# Patient Record
Sex: Female | Born: 1995 | Race: White | Hispanic: No | Marital: Single | Smoking: Never smoker
Health system: Southern US, Community
[De-identification: ages and names within clinical notes are randomized; demographics above are authoritative.]

## PROBLEM LIST (undated history)

## (undated) DIAGNOSIS — J382 Nodules of vocal cords: Secondary | ICD-10-CM

## (undated) DIAGNOSIS — N201 Calculus of ureter: Secondary | ICD-10-CM

## (undated) DIAGNOSIS — N200 Calculus of kidney: Secondary | ICD-10-CM

## (undated) HISTORY — PX: WISDOM TOOTH EXTRACTION: SHX21

## (undated) HISTORY — DX: Nodules of vocal cords: J38.2

---
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
## 2016-10-12 ENCOUNTER — Encounter (HOSPITAL_COMMUNITY): Payer: Self-pay

## 2016-10-12 ENCOUNTER — Emergency Department (HOSPITAL_COMMUNITY): Payer: PRIVATE HEALTH INSURANCE

## 2016-10-12 ENCOUNTER — Emergency Department (HOSPITAL_COMMUNITY)
Admission: EM | Admit: 2016-10-12 | Discharge: 2016-10-12 | Disposition: A | Discharge status: Home / Self Care | Payer: PRIVATE HEALTH INSURANCE | Attending: Emergency Medicine | Admitting: Emergency Medicine

## 2016-10-12 DIAGNOSIS — R0789 Other chest pain: Secondary | ICD-10-CM | POA: Insufficient documentation

## 2016-10-12 DIAGNOSIS — R079 Chest pain, unspecified: Secondary | ICD-10-CM | POA: Diagnosis present

## 2016-10-12 MED ORDER — KETOROLAC TROMETHAMINE 30 MG/ML IJ SOLN
30.0000 mg | Freq: Once | INTRAMUSCULAR | Status: AC
  Administered 2016-10-12: 30 mg via INTRAMUSCULAR
  Filled 2016-10-12: qty 1

## 2016-10-12 MED ORDER — IBUPROFEN 600 MG PO TABS: 600.0000 mg | ORAL_TABLET | Freq: Four times a day (QID) | ORAL | 0 refills | Status: DC | PRN

## 2016-10-12 MED ORDER — ALBUTEROL SULFATE (2.5 MG/3ML) 0.083% IN NEBU: 5.0000 mg | INHALATION_SOLUTION | Freq: Once | RESPIRATORY_TRACT | Status: DC

## 2016-10-12 MED ORDER — OXYCODONE-ACETAMINOPHEN 5-325 MG PO TABS
1.0000 | ORAL_TABLET | ORAL | Status: DC | PRN
  Administered 2016-10-12: 1 via ORAL
  Filled 2016-10-12: qty 1

## 2016-10-12 NOTE — ED Notes (Signed)
Pain medication given in Triage. Patient advised about side effects of medications and  to avoid driving for a minimum of 4 hours.  

## 2016-10-12 NOTE — ED Notes (Signed)
Pt is c/o chest pain to left side of chest 6/10 described as sharp, That began at 3pm  pt repost that she took a percocet and was effective  For a awhile until she awoken and cough and the pain came back. Pt was placed on a monitor

## 2016-10-12 NOTE — ED Provider Notes (Signed)
[facility]-EMERGENCY DEPT Provider Note   CSN: [id number] Arrival date & time: 10/12/16  1535 By signing my name below, I, Levon Hedger, attest that this documentation has been prepared under the direction and in the presence of non-physician practitioner, Audry Pili, PA-C. Electronically Signed: Levon Hedger, Scribe. 10/12/2016. 8:29 PM.   History   Chief Complaint Chief Complaint  Patient presents with  . Chest Pain   HPI Hannah Cummings is an otherwise healthy 21 y.o. female who presents to the Emergency Department complaining of sudden onset, intermittent left upper chest pain onset today at 3 pm. She states the pain began after coughing this afternoon. Pt describes the pain as sharp, 6/10 pain that occurs every 5 seconds and then resolves. She was given percocet with relief of pain. Pt notes associated dizziness and some left arm pain. This is the first time she has experienced this. No FHx of heart problems or personal  hx of PE or DVT. No recent travel or surgeries. She denies any diaphoresis, nausea, vomiting, abdominal pain, or fever.   The history is provided by the patient. No language interpreter was used.    History reviewed. No pertinent past medical history.  There are no active problems to display for this patient.   History reviewed. No pertinent surgical history.  OB History    No data available      Home Medications    Prior to Admission medications   Not on File    Family History History reviewed. No pertinent family history.  Social History Social History  Substance Use Topics  . Smoking status: Never Smoker  . Smokeless tobacco: Never Used  . Alcohol use No    Allergies   Patient has no allergy information on record.   Review of Systems Review of Systems 10 systems reviewed and all are negative for acute change except as noted in the HPI.   Physical Exam Updated Vital Signs BP 119/67 (BP Location: Left Arm)   Pulse 80   Temp 98.4 F (36.9  C) (Oral)   Resp 18   LMP 10/10/2016   SpO2 100%   Physical Exam  Constitutional: She is oriented to person, place, and time. Vital signs are normal. She appears well-developed and well-nourished. No distress.  HENT:  Head: Normocephalic and atraumatic.  Right Ear: Hearing normal.  Left Ear: Hearing normal.  Eyes: Conjunctivae and EOM are normal. Pupils are equal, round, and reactive to light.  Neck: Normal range of motion. Neck supple.  Cardiovascular: Normal rate, regular rhythm, normal heart sounds, intact distal pulses and normal pulses.   No murmur heard. Pulmonary/Chest: Effort normal and breath sounds normal. She has no decreased breath sounds. She has no wheezes. She has no rhonchi. She has no rales. She exhibits tenderness.    TTP Left Upper Anterior chest wall. No palpable or visible deformities.   Abdominal: She exhibits no distension.  Musculoskeletal: Normal range of motion.  Neurological: She is alert and oriented to person, place, and time.  Skin: Skin is warm and dry.  Psychiatric: She has a normal mood and affect. Her speech is normal and behavior is normal. Thought content normal.  Nursing note and vitals reviewed.   ED Treatments / Results  DIAGNOSTIC STUDIES:  Oxygen Saturation is 100% on RA, normal by my interpretation.    COORDINATION OF CARE:  8:26 PM Discussed treatment plan with pt at bedside and pt agreed to plan.   Labs (all labs ordered are listed, but only abnormal results  are displayed) Labs Reviewed - No data to display  EKG  EKG Interpretation None      Radiology Dg Chest 2 View  Result Date: 10/12/2016 CLINICAL DATA:  Onset of coughing 1 of prior to admission to the ED with subsequent severe stabbing left upper left chest pain. Nonsmoker. EXAM: CHEST  2 VIEW COMPARISON:  None in PACs FINDINGS: The lungs are well-expanded. There is no focal infiltrate. There is no pleural effusion, pneumothorax, or pneumomediastinum. The heart and  pulmonary vascularity are normal. The mediastinum is normal in width. The trachea is midline. The bony thorax is unremarkable. The gas pattern in the upper abdomen is normal where visualized. IMPRESSION: There is no acute cardiopulmonary abnormality. Electronically Signed   By: David  SwazilandJordan M.D.   On: 10/12/2016 16:51   Procedures Procedures (including critical care time)  Medications Ordered in ED Medications - No data to display   Initial Impression / Assessment and Plan / ED Course  I have reviewed the triage vital signs and the nursing notes.  Pertinent labs & imaging results that were available during my care of the patient were reviewed by me and considered in my medical decision making (see chart for details).  Clinical Course    Final Clinical Impressions(s) / ED Diagnoses   {I have reviewed and evaluated the relevant imaging studies. {I have interpreted the relevant EKG. {I have reviewed the relevant previous healthcare records.  {I obtained HPI from historian.   ED Course:  Assessment: Pt is a 20yF presents with CP s/p coughing at 3pm. Constant. No N/V. No diaphoresis. Risk Factors for ACS none. Given analgesia in ED. Patient is to be discharged with recommendation to follow up with PCP in regards to today's hospital visit. Chest pain is not likely of cardiac or pulmonary etiology d/t presentation, perc negative, VSS, no tracheal deviation, no JVD or new murmur, RRR, breath sounds equal bilaterally, EKG without acute abnormalities and negative CXR. Heart Score. Likely muscle strain from forceful cough. Pain reproducible on exam. Pt has been advised start a NSAIds and return to the ED is CP becomes exertional, associated with diaphoresis or nausea, radiates to left jaw/arm, worsens or becomes concerning in any way. Pt appears reliable for follow up and is agreeable to discharge. Patient is in no acute distress. Vital Signs are stable. Patient is able to ambulate. Patient able to  tolerate PO.   Disposition/Plan:  DC Home Additional Verbal discharge instructions given and discussed with patient.  Pt Instructed to f/u with PCP in the next week for evaluation and treatment of symptoms. Return precautions given Pt acknowledges and agrees with plan  Supervising Physician Lyndal Pulleyaniel Knott, MD  Final diagnoses:  Chest wall pain    New Prescriptions New Prescriptions   No medications on file   I personally performed the services described in this documentation, which was scribed in my presence. The recorded information has been reviewed and is accurate.    Audry Piliyler Jiovani Mccammon, PA-C 10/12/16 2116    Lyndal Pulleyaniel Knott, MD 10/13/16 [phone]

## 2016-10-12 NOTE — Discharge Instructions (Signed)
Please read and follow all provided instructions.  Your diagnoses today include:  1. Chest wall pain     Tests performed today include: An EKG of your heart A chest x-ray Vital signs. See below for your results today.   Medications prescribed:   Take any prescribed medications only as directed.  Follow-up instructions: Please follow-up with your primary care provider as soon as you can for further evaluation of your symptoms.   Return instructions:  SEEK IMMEDIATE MEDICAL ATTENTION IF: You have severe chest pain, especially if the pain is crushing or pressure-like and spreads to the arms, back, neck, or jaw, or if you have sweating, nausea (feeling sick to your stomach), or shortness of breath. THIS IS AN EMERGENCY. Don't wait to see if the pain will go away. Get medical help at once. Call 911 or 0 (operator). DO NOT drive yourself to the hospital.  Your chest pain gets worse and does not go away with rest.  You have an attack of chest pain lasting longer than usual, despite rest and treatment with the medications your caregiver has prescribed.  You wake from sleep with chest pain or shortness of breath. You feel dizzy or faint. You have chest pain not typical of your usual pain for which you originally saw your caregiver.  You have any other emergent concerns regarding your health.  Additional Information: Chest pain comes from many different causes. Your caregiver has diagnosed you as having chest pain that is not specific for one problem, but does not require admission.  You are at low risk for an acute heart condition or other serious illness.   Your vital signs today were: BP 119/67 (BP Location: Left Arm)    Pulse 80    Temp 98.4 F (36.9 C) (Oral)    Resp 18    LMP 10/10/2016    SpO2 100%  If your blood pressure (BP) was elevated above 135/85 this visit, please have this repeated by your doctor within one month. --------------

## 2016-10-12 NOTE — ED Triage Notes (Signed)
Pt c/o sharp L chest pain and hyperventilation following a cough this afternoon and coughing x 1 week.  Pain score 6/10. Denies health Hx.    Lung sounds are clear.

## 2019-10-03 HISTORY — PX: FOOT FRACTURE SURGERY: SHX645

## 2019-11-10 HISTORY — PX: ANKLE ARTHROSCOPY WITH ARTHRODESIS: SHX5579

## 2021-05-02 HISTORY — PX: WRIST GANGLION EXCISION: SUR520

## 2021-05-02 HISTORY — PX: GANGLION CYST EXCISION: SHX1691

## 2021-10-28 ENCOUNTER — Ambulatory Visit (INDEPENDENT_AMBULATORY_CARE_PROVIDER_SITE_OTHER): Payer: No Typology Code available for payment source | Admitting: Nurse Practitioner

## 2021-10-28 ENCOUNTER — Other Ambulatory Visit: Payer: Self-pay

## 2021-10-28 ENCOUNTER — Encounter: Payer: Self-pay | Admitting: Nurse Practitioner

## 2021-10-28 VITALS — HR 96 | Resp 16 | Wt 139.0 lb

## 2021-10-28 DIAGNOSIS — Z0000 Encounter for general adult medical examination without abnormal findings: Secondary | ICD-10-CM | POA: Diagnosis not present

## 2021-10-28 DIAGNOSIS — R070 Pain in throat: Secondary | ICD-10-CM | POA: Diagnosis not present

## 2021-10-28 DIAGNOSIS — N946 Dysmenorrhea, unspecified: Secondary | ICD-10-CM | POA: Diagnosis not present

## 2021-10-28 DIAGNOSIS — J069 Acute upper respiratory infection, unspecified: Secondary | ICD-10-CM

## 2021-10-28 LAB — POCT RAPID STREP A (OFFICE): Rapid Strep A Screen: NEGATIVE

## 2021-10-28 NOTE — Progress Notes (Signed)
[address], [place]  [address] Phone:  [phone]   Fax:  [phone] Subjective:   Patient ID: Hannah Cummings, female    DOB: [birth year], 26 y.o.   MRN: [id number]  Chief Complaint  Patient presents with   New Patient (Initial Visit)   HPI Donne Robillard 26 y.o. female with no significant medical history to the [facility] for throat pain and to establish care.  Patient states that she has had throat pain x 2 days. Currently rates pain 7-8/10 and describes as soreness. Over the past 2-3 days has had nausea/ vomiting and fever, with highest fever being 101. Took tylenol with moderate relief is symptoms, last fever 99 F. 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   Generally eats healthy and exercises regularly. Last PCP visit 6 mths ago, recently moved to [place] from [place]. Denies any sexual activity at this time, on birth control pills.   Patient concerned about worsening pelvic cramps over the past 2-3 mths. States that she has had severe cramps in the past that resolved with birth control, but now that cramps have returned. Rates pain 10/10, when it occurs. States that pelvic cramps occur 2-3 days prior to menstrual cycle. Also endorses having nausea and vomiting due to cramps. Concerned for possible ovarian cyst, requesting additional screening. Denies any vaginal discharge or other genitourinary symptoms. Denies any cramps or nausea/ vomititng during visit. Denies any other complaints today.  Denies any fever. Denies any fatigue, chest pain, shortness of breath, HA or dizziness. Denies any blurred vision, numbness or tingling.   No past medical history on file.  No past surgical history on file.  No family history on file.  Social History   Socioeconomic History   Marital status: Single    Spouse name: Not on file   Number of children: Not on file   Years of education: Not on file   Highest education  level: Not on file  Occupational History   Not on file  Tobacco Use   Smoking status: Never   Smokeless tobacco: Never  Substance and Sexual Activity   Alcohol use: No   Drug use: No   Sexual activity: Not on file  Other Topics Concern   Not on file  Social History Narrative   Not on file   Social Determinants of Health   Financial Resource Strain: Not on file  Food Insecurity: Not on file  Transportation Needs: Not on file  Physical Activity: Not on file  Stress: Not on file  Social Connections: Not on file  Intimate Partner Violence: Not on file    Outpatient Medications Prior to Visit  Medication Sig Dispense Refill   ibuprofen (ADVIL,MOTRIN) 600 MG tablet Take 1 tablet (600 mg total) by mouth every 6 (six) hours as needed. 30 tablet 0   No facility-administered medications prior to visit.    Not on File  Review of Systems  Constitutional:  Positive for fever. Negative for chills and malaise/fatigue.  HENT:  Positive for sore throat. Negative for congestion, ear discharge, ear pain, hearing loss, nosebleeds, sinus pain and tinnitus.   Eyes: Negative.   Respiratory:  Negative for cough, sputum production, shortness of breath, wheezing and stridor.   Cardiovascular:  Negative for chest pain, palpitations and leg swelling.  Gastrointestinal:  Positive for nausea and vomiting. Negative for abdominal pain, blood in stool, constipation and diarrhea.  Genitourinary: Negative.   Musculoskeletal: Negative.   Skin:  Negative.   Neurological: Negative.   Psychiatric/Behavioral:  Negative for depression. The patient is not nervous/anxious.   All other systems reviewed and are negative.     Objective:    Physical Exam Vitals reviewed.  Constitutional:      General: She is not in acute distress.    Appearance: Normal appearance. She is normal weight.  HENT:     Head: Normocephalic.     Right Ear: Tympanic membrane, ear canal and external ear normal.     Left Ear:  Tympanic membrane, ear canal and external ear normal.     Nose: Nose normal.     Mouth/Throat:     Mouth: Mucous membranes are moist.     Pharynx: Oropharynx is clear.  Eyes:     Extraocular Movements: Extraocular movements intact.     Conjunctiva/sclera: Conjunctivae normal.     Pupils: Pupils are equal, round, and reactive to light.  Neck:     Vascular: No carotid bruit.  Cardiovascular:     Rate and Rhythm: Normal rate and regular rhythm.     Pulses: Normal pulses.     Heart sounds: Normal heart sounds.     Comments: No obvious peripheral edema Pulmonary:     Effort: Pulmonary effort is normal.     Breath sounds: Normal breath sounds.  Abdominal:     General: Abdomen is flat. Bowel sounds are normal. There is no distension.     Palpations: Abdomen is soft. There is no mass.     Tenderness: There is no abdominal tenderness. There is no right CVA tenderness, left CVA tenderness, guarding or rebound.     Hernia: No hernia is present.  Musculoskeletal:        General: No swelling, tenderness, deformity or signs of injury. Normal range of motion.     Cervical back: Normal range of motion and neck supple. No rigidity or tenderness.     Right lower leg: No edema.     Left lower leg: No edema.  Lymphadenopathy:     Cervical: No cervical adenopathy.  Skin:    General: Skin is warm and dry.     Capillary Refill: Capillary refill takes less than 2 seconds.  Neurological:     General: No focal deficit present.     Mental Status: She is alert and oriented to person, place, and time.  Psychiatric:        Mood and Affect: Mood normal.        Behavior: Behavior normal.        Thought Content: Thought content normal.        Judgment: Judgment normal.    Pulse 96    Resp 16    Wt 139 lb (63 kg)    SpO2 98%  Wt Readings from Last 3 Encounters:  10/28/21 139 lb (63 kg)    Immunization History  Administered Date(s) Administered   Influenza-Unspecified 07/06/2021    Diabetic Foot  Exam - Simple   No data filed     No results found for: TSH No results found for: WBC, HGB, HCT, MCV, PLT No results found for: NA, K, CHLORIDE, CO2, GLUCOSE, BUN, CREATININE, BILITOT, ALKPHOS, AST, ALT, PROT, ALBUMIN, CALCIUM, ANIONGAP, EGFR, GFR No results found for: CHOL No results found for: HDL No results found for: LDLCALC No results found for: TRIG No results found for: CHOLHDL No results found for: HGBA1C     Assessment & Plan:   Problem List Items Addressed This Visit  None Visit Diagnoses     Throat pain    -  Primary   Relevant Orders   Rapid Strep A (Completed), negative and reassuring Discussed non pharmacological methods for management of symptoms  Informed to take OTC medications as needed for symptoms    Well adult exam     Patient refused completion of labs due to concern for billing   URI Informed to continue taking OTC medications as needed for symptoms Severe menstrual cramps       Relevant Orders   US Transvaginal Non-OB, to be schedule by support staff Referral to Gyn pending [place] results Informed to take OTC medications as needed for pain   Follow up in 3 mths for reevaluation of symptoms, sooner as needed    I am having Shana Chute maintain her ibuprofen.  No orders of the defined types were placed in this encounter.    Teena Dunk, NP

## 2021-10-28 NOTE — Progress Notes (Signed)
Patient states to having a painful throat, left side is more painful than right today. Pt states vomiting the night before . No cough and fever was broken last night. Temp at 98.0

## 2021-10-28 NOTE — Patient Instructions (Signed)
You were seen today in the [facility] for throat pain and wellness exam. Labs were collected, results will be available via MyChart or, if abnormal, you will be contacted by clinic staff. You were prescribed medications, please take as directed. Please follow up in 3 mths for reevaluation of symptoms.

## 2021-11-03 ENCOUNTER — Ambulatory Visit (HOSPITAL_COMMUNITY): Admission: RE | Admit: 2021-11-03 | Payer: Self-pay | Source: Ambulatory Visit

## 2022-01-03 ENCOUNTER — Emergency Department (HOSPITAL_COMMUNITY): Payer: No Typology Code available for payment source

## 2022-01-03 ENCOUNTER — Emergency Department (HOSPITAL_COMMUNITY)
Admission: EM | Admit: 2022-01-03 | Discharge: 2022-01-03 | Disposition: A | Discharge status: Home / Self Care | Payer: No Typology Code available for payment source | Attending: Emergency Medicine | Admitting: Emergency Medicine

## 2022-01-03 ENCOUNTER — Encounter (HOSPITAL_COMMUNITY): Payer: Self-pay | Admitting: Emergency Medicine

## 2022-01-03 DIAGNOSIS — K529 Noninfective gastroenteritis and colitis, unspecified: Secondary | ICD-10-CM | POA: Diagnosis not present

## 2022-01-03 DIAGNOSIS — R1031 Right lower quadrant pain: Secondary | ICD-10-CM | POA: Diagnosis present

## 2022-01-03 LAB — COMPREHENSIVE METABOLIC PANEL
ALT: 17 U/L (ref 0–44)
AST: 19 U/L (ref 15–41)
Albumin: 4.2 g/dL (ref 3.5–5.0)
Alkaline Phosphatase: 55 U/L (ref 38–126)
Anion gap: 9 (ref 5–15)
BUN: 13 mg/dL (ref 6–20)
CO2: 22 mmol/L (ref 22–32)
Calcium: 9.6 mg/dL (ref 8.9–10.3)
Chloride: 108 mmol/L (ref 98–111)
Creatinine, Ser: 0.7 mg/dL (ref 0.44–1.00)
GFR, Estimated: >60 mL/min (ref >60)
Glucose, Bld: 95 mg/dL (ref 70–99)
Potassium: 3.7 mmol/L (ref 3.5–5.1)
Sodium: 139 mmol/L (ref 135–145)
Total Bilirubin: 0.9 mg/dL (ref 0.3–1.2)
Total Protein: 7.2 g/dL (ref 6.5–8.1)

## 2022-01-03 LAB — CBC
HCT: 40.3 % (ref 36.0–46.0)
Hemoglobin: 13.8 g/dL (ref 12.0–15.0)
MCH: 30 pg (ref 26.0–34.0)
MCHC: 34.2 g/dL (ref 30.0–36.0)
MCV: 87.6 fL (ref 80.0–100.0)
Platelets: 275 10*3/uL (ref 150–400)
RBC: 4.6 MIL/uL (ref 3.87–5.11)
RDW: 12.9 % (ref 11.5–15.5)
WBC: 7 10*3/uL (ref 4.0–10.5)
nRBC: 0 % (ref 0.0–0.2)

## 2022-01-03 LAB — LIPASE, BLOOD: Lipase: 28 U/L (ref 11–51)

## 2022-01-03 LAB — URINALYSIS, ROUTINE W REFLEX MICROSCOPIC
Bilirubin Urine: NEGATIVE
Glucose, UA: NEGATIVE mg/dL
Hgb urine dipstick: NEGATIVE
Ketones, ur: NEGATIVE mg/dL
Leukocytes,Ua: NEGATIVE
Nitrite: NEGATIVE
Protein, ur: NEGATIVE mg/dL
Specific Gravity, Urine: 1.026 (ref 1.005–1.030)
pH: 6 (ref 5.0–8.0)

## 2022-01-03 LAB — I-STAT BETA HCG BLOOD, ED (MC, WL, AP ONLY): I-stat hCG, quantitative: <5 m[IU]/mL (ref <5)

## 2022-01-03 MED ORDER — MORPHINE SULFATE (PF) 4 MG/ML IV SOLN
4.0000 mg | Freq: Once | INTRAVENOUS | Status: AC
  Administered 2022-01-03: 4 mg via INTRAVENOUS
  Filled 2022-01-03: qty 1

## 2022-01-03 MED ORDER — IOHEXOL 300 MG/ML  SOLN
100.0000 mL | Freq: Once | INTRAMUSCULAR | Status: AC | PRN
  Administered 2022-01-03: 100 mL via INTRAVENOUS

## 2022-01-03 MED ORDER — DICYCLOMINE HCL 20 MG PO TABS: 20.0000 mg | ORAL_TABLET | Freq: Two times a day (BID) | ORAL | 0 refills | Status: DC

## 2022-01-03 MED ORDER — LACTATED RINGERS IV BOLUS
1000.0000 mL | Freq: Once | INTRAVENOUS | Status: AC
  Administered 2022-01-03: 1000 mL via INTRAVENOUS

## 2022-01-03 MED ORDER — ONDANSETRON 4 MG PO TBDP
4.0000 mg | ORAL_TABLET | Freq: Once | ORAL | Status: AC | PRN
  Administered 2022-01-03: 4 mg via ORAL
  Filled 2022-01-03: qty 1

## 2022-01-03 MED ORDER — ONDANSETRON 4 MG PO TBDP: 4.0000 mg | ORAL_TABLET | Freq: Three times a day (TID) | ORAL | 0 refills | Status: AC | PRN

## 2022-01-03 NOTE — Discharge Instructions (Addendum)
You were evaluated in the Emergency Department and after careful evaluation, we did not find any emergent condition requiring admission or further testing in the hospital. ? ?Your exam/testing today was overall reassuring.  Your laboratory work-up was completely unremarkable.  Your pelvic ultrasound showed trace free fluid but without as otherwise unremarkable with normal ovaries and uterus.  Your CT abdomen pelvis showed nonobstructing kidney stones in both kidneys which should not be causing your symptoms.  Your symptoms are most consistent with likely gastroenteritis.  Ensure you are continuing to push fluids. Zofran for nausea. Tylenol and Motrin for pain. ? ?Please return to the Emergency Department if you experience any worsening of your condition.  Thank you for allowing [place] to be a part of your care. ? ?

## 2022-01-03 NOTE — ED Triage Notes (Signed)
Patient complains of sudden onset of right lower abdominal pain into her groin that started as a pressure at 0800 this morning as if she needed to have a bowel movement, patient states after bowel movement the pain became sharp. Patient is alert and oriented also reports nausea and vomiting this morning.  ?

## 2022-01-03 NOTE — ED Provider Notes (Signed)
?  Physical Exam  ?BP 111/72   Pulse 78   Temp 98.3 ?F (36.8 ?C) (Oral)   Resp 15   LMP  (Within Days)   SpO2 100%  ? ? ? ?Procedures  ?Procedures ? ?ED Course / MDM  ?  ?Medical Decision Making ?Amount and/or Complexity of Data Reviewed ?Labs: ordered. ?Radiology: ordered. ? ?Risk ?Prescription drug management. ? ? ?Received the patient in signout from [facility] [place].  Briefly, this is a 26 year old female who has had intermittent abdominal discomfort over the past few months.  This morning, she developed sudden onset nausea and vomiting, crampy right lower abdominal discomfort with associated episodes of nonbloody diarrhea x2, described as profusely watery.  Her emesis has been NBNB.  She is currently asymptomatic and tolerating oral intake.  Vitals reviewed and stable.  Extensive work-up performed has been generally unremarkable.  Pelvic ultrasound imaging with trace physiologic free fluid, no other abnormalities, CT abdomen pelvis with bilateral nonobstructing nephrolithiasis.  Symptoms are most consistent with a likely gastroenteritis.  Will prescribe Zofran ODT encourage oral hydration, Tylenol or ibuprofen for pain control, Bentyl and outpatient follow-up.  Stable for discharge. ? ? ? ? ?  ?Ernie Avena, MD ?01/03/22 1515 ? ?

## 2022-01-03 NOTE — ED Provider Notes (Signed)
?[facility] ?Provider Note ? ? ?CSN: [id number] ?Arrival date & time: 01/03/22  0946 ? ?  ? ?History ? ?Chief Complaint  ?Patient presents with  ? Abdominal Pain  ? ? ?Hannah Cummings is a 26 y.o. female otherwise healthy female presenting to the ED for evaluation of right lower quadrant pain that started significantly early this morning.  Patient notes that she has had a dull pain in her right lower quadrant for maybe a month or 2, however this morning she woke up with significant increase in pain this morning along with nausea and vomiting.  She denies fever, chills, urinary symptoms.  She has had some mild diarrhea.  No other treatment prior to arrival.  She denies vaginal pain, discharge.  She has been off birth control for approximately 1 year. ? ? ?Abdominal Pain ? ?  ? ?Home Medications ?Prior to Admission medications   ?Medication Sig Start Date End Date Taking? Authorizing Provider  ?dicyclomine (BENTYL) 20 MG tablet Take 1 tablet (20 mg total) by mouth 2 (two) times daily. 01/03/22  Yes Ernie AvenaLawsing, James, MD  ?ibuprofen (ADVIL) 200 MG tablet Take 200 mg by mouth 2 (two) times daily as needed for mild pain.   Yes [provider]  ?ondansetron (ZOFRAN-ODT) 4 MG disintegrating tablet Take 1 tablet (4 mg total) by mouth every 8 (eight) hours as needed for nausea or vomiting. 01/03/22  Yes Ernie AvenaLawsing, James, MD  ?ibuprofen (ADVIL,MOTRIN) 600 MG tablet Take 1 tablet (600 mg total) by mouth every 6 (six) hours as needed. ?Patient not taking: Reported on 01/03/2022 10/12/16   Audry PiliMohr, Tyler, PA-C  ?   ? ?Allergies    ?Patient has no known allergies.   ? ?Review of Systems   ?Review of Systems  ?Gastrointestinal:  Positive for abdominal pain.  ? ?Physical Exam ?Updated Vital Signs ?BP 111/72   Pulse 78   Temp 98.3 ?F (36.8 ?C) (Oral)   Resp 15   LMP  (Within Days)   SpO2 100%  ?Physical Exam ?Vitals and nursing note reviewed.  ?Constitutional:   ?   General: She is not in acute  distress. ?   Appearance: She is not ill-appearing.  ?HENT:  ?   Head: Atraumatic.  ?Eyes:  ?   Conjunctiva/sclera: Conjunctivae normal.  ?Cardiovascular:  ?   Rate and Rhythm: Normal rate and regular rhythm.  ?   Pulses: Normal pulses.  ?   Heart sounds: No murmur heard. ?Pulmonary:  ?   Effort: Pulmonary effort is normal. No respiratory distress.  ?   Breath sounds: Normal breath sounds.  ?Abdominal:  ?   General: Abdomen is flat. There is no distension.  ?   Palpations: Abdomen is soft.  ?   Tenderness: There is abdominal tenderness in the right lower quadrant and suprapubic area. There is no right CVA tenderness or left CVA tenderness.  ?Musculoskeletal:     ?   General: Normal range of motion.  ?   Cervical back: Normal range of motion.  ?Skin: ?   General: Skin is warm and dry.  ?   Capillary Refill: Capillary refill takes less than 2 seconds.  ?Neurological:  ?   General: No focal deficit present.  ?   Mental Status: She is alert.  ?Psychiatric:     ?   Mood and Affect: Mood normal.  ? ? ?ED Results / Procedures / Treatments   ?Labs ?(all labs ordered are listed, but only abnormal results are  displayed) ?Labs Reviewed  ?URINALYSIS, ROUTINE W REFLEX MICROSCOPIC - Abnormal; Notable for the following components:  ?    Result Value  ? APPearance HAZY (*)   ? All other components within normal limits  ?LIPASE, BLOOD  ?COMPREHENSIVE METABOLIC PANEL  ?CBC  ?I-STAT BETA HCG BLOOD, ED ([facility], [facility], [facility] ONLY)  ? ? ?EKG ?None ? ?Radiology ?CT ABDOMEN PELVIS W CONTRAST ? ?Result Date: 01/03/2022 ?CLINICAL DATA:  RLQ abdominal pain (Age >= 14y) RLQ pain EXAM: CT ABDOMEN AND PELVIS WITH CONTRAST TECHNIQUE: Multidetector CT imaging of the abdomen and pelvis was performed using the standard protocol following bolus administration of intravenous contrast. RADIATION DOSE REDUCTION: This exam was performed according to the departmental dose-optimization program which includes automated exposure control, adjustment of the mA and/or kV  according to patient size and/or use of iterative reconstruction technique. CONTRAST:  OMNIPAQUE IOHEXOL 300 MG/ML  SOLN COMPARISON:  Same day ultrasound FINDINGS: Lower chest: No acute abnormality. Hepatobiliary: No focal liver abnormality is seen. The gallbladder is unremarkable. Pancreas: Unremarkable. No pancreatic ductal dilatation or surrounding inflammatory changes. Spleen: Normal in size.  Small adjacent splenules. Adrenals/Urinary Tract: Adrenal glands are unremarkable. No hydronephrosis. There are nonobstructive renal stones bilaterally, measuring 3 mm on the right in the lower pole and 3 mm in the left upper pole. Bladder is moderately distended. Stomach/Bowel: The stomach is within normal limits. There is no evidence of bowel obstruction.The appendix is normal. Vascular/Lymphatic: No significant vascular findings are present. No enlarged abdominal or pelvic lymph nodes. Reproductive: Unremarkable for age. Other: No abdominal wall hernia or abnormality. No abdominopelvic ascites. Musculoskeletal: No acute or significant osseous findings. IMPRESSION: Nonobstructive nephrolithiasis bilaterally. No hydronephrosis or ureterolithiasis. No other acute findings.  The appendix is normal. Electronically Signed   By: Caprice Renshaw M.D.   On: 01/03/2022 14:53  ? ?US PELVIC COMPLETE W TRANSVAGINAL AND TORSION R/O ? ?Result Date: 01/03/2022 ?CLINICAL DATA:  RIGHT lower quadrant abdominal pain; LMP 12/30/2021 EXAM: TRANSABDOMINAL AND TRANSVAGINAL ULTRASOUND OF PELVIS DOPPLER ULTRASOUND OF OVARIES TECHNIQUE: Both transabdominal and transvaginal ultrasound examinations of the pelvis were performed. Transabdominal technique was performed for global imaging of the pelvis including uterus, ovaries, adnexal regions, and pelvic cul-de-sac. It was necessary to proceed with endovaginal exam following the transabdominal exam to visualize the uterus, endometrium, and ovaries. Color and duplex Doppler ultrasound was utilized to  evaluate blood flow to the ovaries. COMPARISON:  None FINDINGS: Uterus Measurements: 6.2 x 3.4 x 4.1 cm = volume: 45 mL. Anteverted. Normal morphology without mass Endometrium Thickness: 3 mm.  No endometrial fluid or mass Right ovary Measurements: 2.7 x 1.7 x 1.6 cm = volume: 3.8 mL. Normal morphology without mass. Internal blood flow present on color Doppler imaging. Left ovary Measurements: 2.2 x 1.5 x 1.4 cm = volume: 2.4 mL. Normal morphology without mass. Internal blood flow present on color Doppler imaging. Pulsed Doppler evaluation of both ovaries demonstrates normal low-resistance arterial and venous waveforms. Other findings Trace free pelvic fluid.  No adnexal masses. IMPRESSION: Normal exam. Electronically Signed   By: Ulyses Southward M.D.   On: 01/03/2022 13:31   ? ?Procedures ?Procedures  ? ? ?Medications Ordered in ED ?Medications  ?ondansetron (ZOFRAN-ODT) disintegrating tablet 4 mg (4 mg Oral Given 01/03/22 0952)  ?lactated ringers bolus 1,000 mL (0 mLs Intravenous Stopped 01/03/22 1459)  ?morphine (PF) 4 MG/ML injection 4 mg (4 mg Intravenous Given 01/03/22 1229)  ?iohexol (OMNIPAQUE) 300 MG/ML solution 100 mL (100 mLs Intravenous Contrast Given 01/03/22  1443)  ? ? ?ED Course/ Medical Decision Making/ A&P ?  ?                        ?Medical Decision Making ?Amount and/or Complexity of Data Reviewed ?Labs: ordered. ?Radiology: ordered. ? ?Risk ?Prescription drug management. ? ? ?History:  ?Per HPI ?Social determinants of health: None ? ?Initial impression: ? ?This patient presents to the ED for concern of RLQ/pelvic pain, this involves an extensive number of treatment options, and is a complaint that carries with it a high risk of complications and morbidity.   Differential diagnosis of her lower abdominal considerations include pelvic inflammatory disease, ectopic pregnancy, appendicitis, urinary calculi, primary dysmenorrhea, septic abortion, ruptured ovarian cyst or tumor, ovarian torsion, tubo-ovarian  abscess, degeneration of fibroid, endometriosis, diverticulitis, cystitis. ? ? ?Lab Tests and EKG: ? ?I Ordered, reviewed, and interpreted labs and EKG.  The pertinent results include:  ?UA, CMP, CBC lipase and p

## 2022-01-27 ENCOUNTER — Ambulatory Visit: Payer: PRIVATE HEALTH INSURANCE | Admitting: Nurse Practitioner

## 2022-07-11 ENCOUNTER — Emergency Department (HOSPITAL_BASED_OUTPATIENT_CLINIC_OR_DEPARTMENT_OTHER): Payer: No Typology Code available for payment source

## 2022-07-11 ENCOUNTER — Encounter (HOSPITAL_BASED_OUTPATIENT_CLINIC_OR_DEPARTMENT_OTHER): Payer: Self-pay | Admitting: Emergency Medicine

## 2022-07-11 ENCOUNTER — Other Ambulatory Visit: Payer: Self-pay

## 2022-07-11 ENCOUNTER — Emergency Department (HOSPITAL_COMMUNITY)
Admission: EM | Admit: 2022-07-11 | Discharge: 2022-07-11 | Discharge status: Against Medical Advice | Payer: PRIVATE HEALTH INSURANCE

## 2022-07-11 ENCOUNTER — Emergency Department (HOSPITAL_BASED_OUTPATIENT_CLINIC_OR_DEPARTMENT_OTHER)
Admission: EM | Admit: 2022-07-11 | Discharge: 2022-07-11 | Disposition: A | Discharge status: Home / Self Care | Payer: No Typology Code available for payment source | Attending: Emergency Medicine | Admitting: Emergency Medicine

## 2022-07-11 DIAGNOSIS — Z1152 Encounter for screening for COVID-19: Secondary | ICD-10-CM | POA: Insufficient documentation

## 2022-07-11 DIAGNOSIS — N132 Hydronephrosis with renal and ureteral calculous obstruction: Secondary | ICD-10-CM | POA: Diagnosis not present

## 2022-07-11 DIAGNOSIS — N200 Calculus of kidney: Secondary | ICD-10-CM

## 2022-07-11 DIAGNOSIS — R1032 Left lower quadrant pain: Secondary | ICD-10-CM | POA: Diagnosis present

## 2022-07-11 LAB — CBC
HCT: 38.1 % (ref 36.0–46.0)
Hemoglobin: 13.1 g/dL (ref 12.0–15.0)
MCH: 29.7 pg (ref 26.0–34.0)
MCHC: 34.4 g/dL (ref 30.0–36.0)
MCV: 86.4 fL (ref 80.0–100.0)
Platelets: 280 10*3/uL (ref 150–400)
RBC: 4.41 MIL/uL (ref 3.87–5.11)
RDW: 13.2 % (ref 11.5–15.5)
WBC: 6.8 10*3/uL (ref 4.0–10.5)
nRBC: 0 % (ref 0.0–0.2)

## 2022-07-11 LAB — COMPREHENSIVE METABOLIC PANEL
ALT: 19 U/L (ref 0–44)
AST: 55 U/L — ABNORMAL HIGH (ref 15–41)
Albumin: 4.1 g/dL (ref 3.5–5.0)
Alkaline Phosphatase: 44 U/L (ref 38–126)
Anion gap: 13 (ref 5–15)
BUN: 15 mg/dL (ref 6–20)
CO2: 19 mmol/L — ABNORMAL LOW (ref 22–32)
Calcium: 9.2 mg/dL (ref 8.9–10.3)
Chloride: 108 mmol/L (ref 98–111)
Creatinine, Ser: 0.88 mg/dL (ref 0.44–1.00)
GFR, Estimated: >60 mL/min (ref >60)
Glucose, Bld: 105 mg/dL — ABNORMAL HIGH (ref 70–99)
Potassium: 3.2 mmol/L — ABNORMAL LOW (ref 3.5–5.1)
Sodium: 140 mmol/L (ref 135–145)
Total Bilirubin: 0.8 mg/dL (ref 0.3–1.2)
Total Protein: 7.5 g/dL (ref 6.5–8.1)

## 2022-07-11 LAB — HCG, SERUM, QUALITATIVE: Preg, Serum: NEGATIVE

## 2022-07-11 LAB — URINALYSIS, ROUTINE W REFLEX MICROSCOPIC
Bilirubin Urine: NEGATIVE
Glucose, UA: NEGATIVE mg/dL
Ketones, ur: NEGATIVE mg/dL
Leukocytes,Ua: NEGATIVE
Nitrite: NEGATIVE
RBC / HPF: >50 RBC/hpf — ABNORMAL HIGH (ref 0–5)
Specific Gravity, Urine: 1.027 (ref 1.005–1.030)
pH: 6 (ref 5.0–8.0)

## 2022-07-11 LAB — RESP PANEL BY RT-PCR (FLU A&B, COVID) ARPGX2
Influenza A by PCR: NEGATIVE
Influenza B by PCR: NEGATIVE
SARS Coronavirus 2 by RT PCR: NEGATIVE

## 2022-07-11 LAB — LIPASE, BLOOD: Lipase: 15 U/L (ref 11–51)

## 2022-07-11 MED ORDER — KETOROLAC TROMETHAMINE 15 MG/ML IJ SOLN
15.0000 mg | Freq: Once | INTRAMUSCULAR | Status: AC
  Administered 2022-07-11: 15 mg via INTRAVENOUS
  Filled 2022-07-11: qty 1

## 2022-07-11 MED ORDER — FENTANYL CITRATE PF 50 MCG/ML IJ SOSY
50.0000 ug | PREFILLED_SYRINGE | INTRAMUSCULAR | Status: AC | PRN
  Administered 2022-07-11 (×2): 50 ug via INTRAVENOUS
  Filled 2022-07-11 (×2): qty 1

## 2022-07-11 MED ORDER — TAMSULOSIN HCL 0.4 MG PO CAPS
0.4000 mg | ORAL_CAPSULE | Freq: Once | ORAL | Status: AC
  Administered 2022-07-11: 0.4 mg via ORAL
  Filled 2022-07-11: qty 1

## 2022-07-11 MED ORDER — OXYCODONE HCL 5 MG PO TABS
10.0000 mg | ORAL_TABLET | Freq: Once | ORAL | Status: AC
  Administered 2022-07-11: 10 mg via ORAL
  Filled 2022-07-11: qty 2

## 2022-07-11 MED ORDER — OXYCODONE HCL 5 MG PO TABS: 5.0000 mg | ORAL_TABLET | Freq: Four times a day (QID) | ORAL | 0 refills | Status: AC | PRN

## 2022-07-11 MED ORDER — TAMSULOSIN HCL 0.4 MG PO CAPS: 0.4000 mg | ORAL_CAPSULE | Freq: Every day | ORAL | 0 refills | Status: AC

## 2022-07-11 MED ORDER — ONDANSETRON HCL 4 MG/2ML IJ SOLN
4.0000 mg | Freq: Once | INTRAMUSCULAR | Status: AC
  Administered 2022-07-11: 4 mg via INTRAVENOUS
  Filled 2022-07-11: qty 2

## 2022-07-11 MED ORDER — LACTATED RINGERS IV BOLUS
1000.0000 mL | Freq: Once | INTRAVENOUS | Status: AC
  Administered 2022-07-11: 1000 mL via INTRAVENOUS

## 2022-07-11 MED ORDER — ACETAMINOPHEN 500 MG PO TABS
1000.0000 mg | ORAL_TABLET | Freq: Once | ORAL | Status: AC
  Administered 2022-07-11: 1000 mg via ORAL
  Filled 2022-07-11: qty 2

## 2022-07-11 NOTE — ED Triage Notes (Signed)
Pt arrives to ED with c/o LLQ abdominal pain and left flank pain that started at 530am this morning.

## 2022-07-11 NOTE — ED Notes (Signed)
Patient left on own accord °

## 2022-07-11 NOTE — ED Provider Notes (Signed)
[facility] EMERGENCY DEPT Provider Note   CSN: [id number] Arrival date & time: 07/11/22  9629     History Chief Complaint  Patient presents with   Abdominal Pain    HPI Hannah Cummings is a 26 y.o. female presenting for left-sided flank pain.  She has a history nephrolithiasis that caused a presentation for similar earlier this year.  She states that at 530 this morning she had severe sudden onset left-sided flank pain that woke her up from sleep.  It radiates into her left groin.  She denies fevers or chills, nausea vomiting, syncope shortness of breath. Patient is otherwise ambulatory tolerating p.o. intake.  No history of ovarian cysts or other ovarian pathology.   Patient's recorded medical, surgical, social, medication list and allergies were reviewed in the Snapshot window as part of the initial history.   Review of Systems   Review of Systems  Constitutional:  Negative for chills and fever.  HENT:  Negative for ear pain and sore throat.   Eyes:  Negative for pain and visual disturbance.  Respiratory:  Negative for cough and shortness of breath.   Cardiovascular:  Negative for chest pain and palpitations.  Gastrointestinal:  Negative for abdominal pain and vomiting.  Genitourinary:  Positive for flank pain. Negative for dysuria and hematuria.  Musculoskeletal:  Negative for arthralgias and back pain.  Skin:  Negative for color change and rash.  Neurological:  Negative for seizures and syncope.  All other systems reviewed and are negative.   Physical Exam Updated Vital Signs BP 116/70   Pulse 70   Temp 97.9 F (36.6 C) (Temporal)   Resp 18   Ht 5\' 3"  (1.6 m)   Wt 63.5 kg   SpO2 100%   BMI 24.80 kg/m  Physical Exam Vitals and nursing note reviewed.  Constitutional:      General: She is not in acute distress.    Appearance: She is well-developed.  HENT:     Head: Normocephalic and atraumatic.  Eyes:     Conjunctiva/sclera: Conjunctivae  normal.  Cardiovascular:     Rate and Rhythm: Normal rate and regular rhythm.     Heart sounds: No murmur heard. Pulmonary:     Effort: Pulmonary effort is normal. No respiratory distress.     Breath sounds: Normal breath sounds.  Abdominal:     Palpations: Abdomen is soft.     Tenderness: There is no abdominal tenderness. There is left CVA tenderness. There is no right CVA tenderness or guarding. Negative signs include Murphy's sign.  Musculoskeletal:        General: No swelling.     Cervical back: Neck supple.  Skin:    General: Skin is warm and dry.     Capillary Refill: Capillary refill takes less than 2 seconds.  Neurological:     Mental Status: She is alert.  Psychiatric:        Mood and Affect: Mood normal.      ED Course/ Medical Decision Making/ A&P    Procedures Procedures   Medications Ordered in ED Medications  fentaNYL (SUBLIMAZE) injection 50 mcg (50 mcg Intravenous Given 07/11/22 0822)  ondansetron (ZOFRAN) injection 4 mg (4 mg Intravenous Given 07/11/22 0751)  ketorolac (TORADOL) 15 MG/ML injection 15 mg (15 mg Intravenous Given 07/11/22 0814)  acetaminophen (TYLENOL) tablet 1,000 mg (1,000 mg Oral Given 07/11/22 0959)  oxyCODONE (Oxy IR/ROXICODONE) immediate release tablet 10 mg (10 mg Oral Given 07/11/22 0959)  lactated ringers bolus 1,000 mL (1,000 mLs  Intravenous New Bag/Given 07/11/22 1054)  tamsulosin (FLOMAX) capsule 0.4 mg (0.4 mg Oral Given 07/11/22 1054)    Medical Decision Making:    Hannah Cummings is a 26 y.o. female who presented to the ED today with left sided flank pain detailed above.     Patient's presentation is complicated by their history of past UL.  Patient placed on continuous vitals and telemetry monitoring while in ED which was reviewed periodically.   Complete initial physical exam performed, notably the patient  was HDS in nad.      Reviewed and confirmed nursing documentation for past medical history, family history,  social history.    Initial Assessment:   With the patient's presentation of left-sided flank pain, most likely diagnosis is nephrolithiasis. Other diagnoses were considered including (but not limited to) musculoskeletal etiology, appendicitis, cholecystitis, small bowel obstruction, intermittent ovarian torsion. These are considered less likely due to history of present illness and physical exam findings.   This is most consistent with an acute life/limb threatening illness complicated by underlying chronic conditions.  Initial Plan:  Screening labs including CBC and Metabolic panel to evaluate for infectious or metabolic etiology of disease.  Urinalysis with reflex culture ordered to evaluate for UTI or relevant urologic/nephrologic pathology.  Renal ultrasound to evaluate for intra-abdominal pathology including evidence of hydronephrosis. Shared medical decision making with patient regarding further cross-sectional imaging, patient had a CT scan earlier this year and would prefer to not proceed with any further radiographic evaluation unless absolutely needed.  Overall this is reasonable and given prior diagnosis of nephrolithiasis, consistent with CT of history with nephrolithiasis, favor that proceeding with further radiographic imaging would be unlikely to be diagnostic and would likely induce harm on the patient. Additionally, discussed ovarian torsion with patient, she has no history of pelvic cysts and pain localizes to flank over left lower quadrant.  However,, given time sensitive nature of this diagnosis, proceed with torsion ultrasound study. Objective evaluation as below reviewed with plan for close reassessment after pain control  Initial Study Results:   Laboratory  All laboratory results reviewed without evidence of clinically relevant pathology.   Exceptions include: Blood in urine   EKG EKG was reviewed independently. Rate, rhythm, axis, intervals all examined and without  medically relevant abnormality. ST segments without concerns for elevations.    Radiology  All images reviewed independently. Agree with radiology report at this time.   CT Renal Stone Study  Result Date: 07/11/2022 CLINICAL DATA:  Left lower quadrant abdominal pain, flank pain EXAM: CT ABDOMEN AND PELVIS WITHOUT CONTRAST TECHNIQUE: Multidetector CT imaging of the abdomen and pelvis was performed following the standard protocol without IV contrast. RADIATION DOSE REDUCTION: This exam was performed according to the departmental dose-optimization program which includes automated exposure control, adjustment of the mA and/or kV according to patient size and/or use of iterative reconstruction technique. COMPARISON:  CT January 03, 2022 and pelvic as well as renal ultrasound dated July 11, 2022. FINDINGS: Lower chest: No acute abnormality Hepatobiliary: Unremarkable noncontrast enhanced appearance of the hepatic parenchyma. Gallbladder is unremarkable. No biliary ductal dilation. Pancreas: No pancreatic ductal dilation or evidence of acute inflammation. Spleen: No splenomegaly. Adrenals/Urinary Tract: Bilateral adrenal glands appear normal. Mild left hydronephrosis secondary to a 3 mm stone in the proximal ureter at the L3 vertebral body level. Nonobstructive 3 mm right interpolar renal calculus. Urinary bladder is unremarkable for degree of distension. Stomach/Bowel: Stomach is unremarkable for degree of distension. No pathologic dilation of small or large  bowel. Normal appendix and terminal ileum. No evidence of acute bowel inflammation. Vascular/Lymphatic: Normal caliber abdominal aorta. No pathologically enlarged abdominal or pelvic lymph nodes Reproductive: Uterus and bilateral adnexa are unremarkable. Other: Trace pelvic free fluid is within physiologic normal limits. Musculoskeletal: No acute or significant osseous findings. IMPRESSION: 1. Mild left hydronephrosis secondary to a 3 mm stone in the proximal  ureter at the L3 vertebral body level. 2. Nonobstructive 3 mm right interpolar renal calculus. Electronically Signed   By: Maudry Mayhew M.D.   On: 07/11/2022 10:18   US PELVIC COMPLETE W TRANSVAGINAL AND TORSION R/O  Result Date: 07/11/2022 CLINICAL DATA:  Left lower quadrant pain. EXAM: TRANSABDOMINAL AND TRANSVAGINAL ULTRASOUND OF PELVIS DOPPLER ULTRASOUND OF OVARIES TECHNIQUE: Both transabdominal and transvaginal ultrasound examinations of the pelvis were performed. Transabdominal technique was performed for global imaging of the pelvis including uterus, ovaries, adnexal regions, and pelvic cul-de-sac. It was necessary to proceed with endovaginal exam following the transabdominal exam to visualize the left adnexa. Color and duplex Doppler ultrasound was utilized to evaluate blood flow to the ovaries. COMPARISON:  01/03/2022 FINDINGS: Uterus Measurements: 5.5 x 3.2 x 5.0 cm = volume: 46 mL. No fibroids or other mass visualized. Endometrium Thickness: 4 mm.  No focal abnormality visualized. Right ovary Measurements: 3.4 x 2.1 x 2.0 cm = volume: 7.6 mL. Normal appearance/no adnexal mass. Left ovary Measurements: 2.4 x 2.2 x 1.7 cm = volume: 4.6 mL. Normal appearance/no adnexal mass. Pulsed Doppler evaluation of both ovaries demonstrates normal low-resistance arterial and venous waveforms. Other findings No abnormal free fluid. IMPRESSION: No acute findings. No evidence for ovarian torsion. Electronically Signed   By: Kennith Center M.D.   On: 07/11/2022 09:25   US Renal  Result Date: 07/11/2022 CLINICAL DATA:  Left leg pain since 05:30 this morning. Evaluate for hydronephrosis. EXAM: RENAL / URINARY TRACT ULTRASOUND COMPLETE COMPARISON:  CT abdomen and pelvis 01/03/2022 FINDINGS: Right Kidney: Renal measurements: 9.7 x 3.7 x 5.2 cm = volume: 99 mL. Echogenicity within normal limits. No mass or hydronephrosis visualized. No stone is visualized. Note is made a tiny 3 mm right interpolar nonobstructing stone  was seen on prior CT. Left Kidney: Renal measurements: 12.3 x 5.9 x 5.8 cm = volume: 217 mL. Echogenicity within normal limits. No mass visualized. There is mild fullness of the renal pelvis. No overt caliectasis. 3 mm mid upper pole left renal stone seen on prior CT is not seen on the current study. Bladder: The patient had just voided prior to this exam (submitted urine specimen), limiting evaluation of the urinary bladder. Other: None. IMPRESSION: Mild left pelviectasis without definite enlargement of the renal calices. This may represent minimal left hydronephrosis. No renal stone is identified, however note is made that 3 mm left upper pole and 3 mm right interpolar nonobstructing renal stones were seen on prior CT 01/03/2022. Electronically Signed   By: Neita Garnet M.D.   On: 07/11/2022 09:19      Final Assessment and Plan:   On repeat evaluation, patient's pain is completely under control.  She is ambulatory feels like she can tolerate p.o. intake.  Identified a migratory left-sided 3 mm urolithiasis.  Likely etiology of patient's symptoms.  No evidence of pelvic disease, torsion or any other acute abnormalities.  Patient stable for outpatient care and management with plan to follow-up with urology for medical expulsion therapy with strict return precautions regarding uncontrolled pain, fevers or infection, uncontrolled nausea.  Patient ambulatory tolerating p.o. intake stable for  outpatient care and management at this time.   Clinical Impression:  1. Nephrolithiasis      Discharge   Final Clinical Impression(s) / ED Diagnoses Final diagnoses:  Nephrolithiasis    Rx / DC Orders ED Discharge Orders          Ordered    oxyCODONE (ROXICODONE) 5 MG immediate release tablet  Every 6 hours PRN        07/11/22 1110    tamsulosin (FLOMAX) 0.4 MG CAPS capsule  Daily        07/11/22 1110    Ambulatory referral to Urology        07/11/22 1110              Glyn Ade,  MD 07/11/22 1111

## 2022-07-14 ENCOUNTER — Ambulatory Visit: Payer: Self-pay

## 2022-07-14 NOTE — Telephone Encounter (Signed)
  Chief Complaint: severe left flank pain Symptoms: severe pain and left sided pelvic pain Frequency: Tuesday Pertinent Negatives: Patient denies fever, abdomen pain, vomiting, leg weakness, burning with urination, blood in urine Disposition: [x] ED /[] Urgent Care (no appt availability in office) / [] Appointment(In office/virtual)/ []  [place] [facility]/ [] Home Care/ [] Refused Recommended Disposition /[] [facility]/ []  Follow-up with PCP Additional Notes: n/a Reason for Disposition  [1] SEVERE pain (e.g., excruciating, scale 8-10) AND [2] not improved after pain medicine  Answer Assessment - Initial Assessment Questions 1. LOCATION: "Where does it hurt?" (e.g., left, right)     left 2. ONSET: "When did the pain start?"     Tuesday at 0530 3. SEVERITY: "How bad is the pain?" (e.g., Scale 1-10; mild, moderate, or severe)   - MILD (1-3): doesn't interfere with normal activities    - MODERATE (4-7): interferes with normal activities or awakens from sleep    - SEVERE (8-10): excruciating pain and patient unable to do normal activities (stays in bed)       severe 4. PATTERN: "Does the pain come and go, or is it constant?"      constant 5. CAUSE: "What do you think is causing the pain?"     Kidney stone 6. OTHER SYMPTOMS:  "Do you have any other symptoms?" (e.g., fever, abdomen pain, vomiting, leg weakness, burning with urination, blood in urine)    Pelvic pain on left side 7. PREGNANCY:  "Is there any chance you are pregnant?" "When was your last menstrual period?"     *No Answer*  Protocols used: Flank Pain-A-AH

## 2022-07-18 NOTE — ED Notes (Signed)
Pt called d/t ongoing symptoms and needs f/u referral to a urologist

## 2022-08-04 ENCOUNTER — Other Ambulatory Visit: Payer: Self-pay | Admitting: Urology

## 2022-08-08 ENCOUNTER — Encounter (HOSPITAL_BASED_OUTPATIENT_CLINIC_OR_DEPARTMENT_OTHER): Payer: Self-pay | Admitting: Urology

## 2022-08-08 NOTE — Progress Notes (Signed)
Spoke w/ via phone for pre-op interview--- Hannah Cummings  Lab needs dos---- urine pregnancy, CXR, EKG             Lab results------ in EPIC COVID test -----patient states asymptomatic no test needed Arrive at ------- 0645 on 08/11/2022 NPO after MN NO Solid Food.  Clear liquids from MN until--- 0545 on 08/11/2022 Med rec completed Medications to take morning of surgery ----- flomax, zofran prn  Diabetic medication ----- n/a Patient instructed no nail polish to be worn day of surgery Patient instructed to bring photo id and insurance card day of surgery Patient aware to have Driver (ride ) / caregiver    for 24 hours after surgery - Lavenia Atlas (mother) [phone] Patient Special Instructions ----- none Pre-Op special Istructions ----- none Patient verbalized understanding of instructions that were given at this phone interview. Patient denies shortness of breath, chest pain, fever, cough at this phone interview.  Lyndel Pleasure, RN

## 2022-08-10 ENCOUNTER — Encounter (HOSPITAL_BASED_OUTPATIENT_CLINIC_OR_DEPARTMENT_OTHER): Payer: Self-pay | Admitting: Urology

## 2022-08-10 NOTE — Anesthesia Preprocedure Evaluation (Addendum)
Anesthesia Evaluation  Patient identified by MRN, date of birth, ID band Patient awake    Reviewed: Allergy & Precautions, NPO status , Patient's Chart, lab work & pertinent test results  Airway Mallampati: II       Dental  (+) Teeth Intact, Dental Advisory Given   Pulmonary  VC nodule   Pulmonary exam normal breath sounds clear to auscultation       Cardiovascular negative cardio ROS Normal cardiovascular exam Rhythm:Regular Rate:Normal     Neuro/Psych negative neurological ROS  negative psych ROS   GI/Hepatic negative GI ROS, Neg liver ROS,,,  Endo/Other  negative endocrine ROS    Renal/GU Bilateral renal calculi  negative genitourinary   Musculoskeletal negative musculoskeletal ROS (+)    Abdominal   Peds  Hematology negative hematology ROS (+)   Anesthesia Other Findings   Reproductive/Obstetrics negative OB ROS                              Anesthesia Physical Anesthesia Plan  ASA: 2  Anesthesia Plan: General   Post-op Pain Management: Minimal or no pain anticipated, Regional block*, Toradol IV (intra-op)* and Precedex   Induction: Intravenous  PONV Risk Score and Plan: 4 or greater and Treatment may vary due to age or medical condition, Midazolam, Ondansetron and Dexamethasone  Airway Management Planned: LMA  Additional Equipment: None  Intra-op Plan:   Post-operative Plan: Extubation in OR  Informed Consent: I have reviewed the patients History and Physical, chart, labs and discussed the procedure including the risks, benefits and alternatives for the proposed anesthesia with the patient or authorized representative who has indicated his/her understanding and acceptance.     Dental advisory given  Plan Discussed with: Anesthesiologist and CRNA  Anesthesia Plan Comments:         Anesthesia Quick Evaluation

## 2022-08-11 ENCOUNTER — Ambulatory Visit (HOSPITAL_BASED_OUTPATIENT_CLINIC_OR_DEPARTMENT_OTHER): Payer: No Typology Code available for payment source | Admitting: Anesthesiology

## 2022-08-11 ENCOUNTER — Encounter (HOSPITAL_BASED_OUTPATIENT_CLINIC_OR_DEPARTMENT_OTHER): Payer: Self-pay | Admitting: Urology

## 2022-08-11 ENCOUNTER — Encounter (HOSPITAL_BASED_OUTPATIENT_CLINIC_OR_DEPARTMENT_OTHER)
Admission: RE | Disposition: A | Discharge status: Home / Self Care | Payer: Self-pay | Source: Home / Self Care | Attending: Urology

## 2022-08-11 ENCOUNTER — Other Ambulatory Visit: Payer: Self-pay

## 2022-08-11 ENCOUNTER — Ambulatory Visit (HOSPITAL_COMMUNITY): Payer: No Typology Code available for payment source

## 2022-08-11 ENCOUNTER — Ambulatory Visit (HOSPITAL_COMMUNITY)
Admission: RE | Admit: 2022-08-11 | Discharge: 2022-08-11 | Disposition: A | Discharge status: Home / Self Care | Payer: No Typology Code available for payment source | Attending: Urology | Admitting: Urology

## 2022-08-11 DIAGNOSIS — N201 Calculus of ureter: Secondary | ICD-10-CM

## 2022-08-11 DIAGNOSIS — N132 Hydronephrosis with renal and ureteral calculous obstruction: Secondary | ICD-10-CM | POA: Insufficient documentation

## 2022-08-11 DIAGNOSIS — N135 Crossing vessel and stricture of ureter without hydronephrosis: Secondary | ICD-10-CM

## 2022-08-11 HISTORY — PX: CYSTOSCOPY WITH RETROGRADE PYELOGRAM, URETEROSCOPY AND STENT PLACEMENT: SHX5789

## 2022-08-11 LAB — POCT PREGNANCY, URINE: Preg Test, Ur: NEGATIVE

## 2022-08-11 SURGERY — CYSTOURETEROSCOPY, WITH RETROGRADE PYELOGRAM AND STENT INSERTION
Anesthesia: General | Site: Urethra | Laterality: Bilateral

## 2022-08-11 MED ORDER — ACETAMINOPHEN 325 MG PO TABS: 650.0000 mg | ORAL_TABLET | ORAL | Status: DC | PRN

## 2022-08-11 MED ORDER — SODIUM CHLORIDE 0.9 % IV SOLN: 250.0000 mL | INTRAVENOUS | Status: DC | PRN

## 2022-08-11 MED ORDER — MIDAZOLAM HCL 5 MG/5ML IJ SOLN
INTRAMUSCULAR | Status: DC | PRN
  Administered 2022-08-11: 2 mg via INTRAVENOUS

## 2022-08-11 MED ORDER — FENTANYL CITRATE (PF) 100 MCG/2ML IJ SOLN
INTRAMUSCULAR | Status: AC
  Filled 2022-08-11: qty 2

## 2022-08-11 MED ORDER — SODIUM CHLORIDE 0.9% FLUSH: 3.0000 mL | INTRAVENOUS | Status: DC | PRN

## 2022-08-11 MED ORDER — DEXAMETHASONE SODIUM PHOSPHATE 4 MG/ML IJ SOLN
INTRAMUSCULAR | Status: DC | PRN
  Administered 2022-08-11: 5 mg via INTRAVENOUS

## 2022-08-11 MED ORDER — PHENYLEPHRINE HCL (PRESSORS) 10 MG/ML IV SOLN
INTRAVENOUS | Status: DC | PRN
  Administered 2022-08-11: 80 ug via INTRAVENOUS

## 2022-08-11 MED ORDER — IOHEXOL 300 MG/ML  SOLN
INTRAMUSCULAR | Status: DC | PRN
  Administered 2022-08-11: 5 mL via URETHRAL

## 2022-08-11 MED ORDER — HYDROMORPHONE HCL 1 MG/ML IJ SOLN
0.2500 mg | INTRAMUSCULAR | Status: DC | PRN
  Administered 2022-08-11 (×2): 0.5 mg via INTRAVENOUS

## 2022-08-11 MED ORDER — OXYCODONE HCL 5 MG PO TABS
ORAL_TABLET | ORAL | Status: AC
  Filled 2022-08-11: qty 1

## 2022-08-11 MED ORDER — CEFAZOLIN SODIUM-DEXTROSE 2-4 GM/100ML-% IV SOLN
2.0000 g | INTRAVENOUS | Status: AC
  Administered 2022-08-11: 2 g via INTRAVENOUS

## 2022-08-11 MED ORDER — FENTANYL CITRATE (PF) 100 MCG/2ML IJ SOLN
INTRAMUSCULAR | Status: DC | PRN
  Administered 2022-08-11 (×4): 25 ug via INTRAVENOUS
  Administered 2022-08-11: 50 ug via INTRAVENOUS
  Administered 2022-08-11 (×2): 25 ug via INTRAVENOUS

## 2022-08-11 MED ORDER — AMISULPRIDE (ANTIEMETIC) 5 MG/2ML IV SOLN: 10.0000 mg | Freq: Once | INTRAVENOUS | Status: DC | PRN

## 2022-08-11 MED ORDER — MORPHINE SULFATE (PF) 4 MG/ML IV SOLN: 2.0000 mg | INTRAVENOUS | Status: DC | PRN

## 2022-08-11 MED ORDER — HYDROMORPHONE HCL 1 MG/ML IJ SOLN
INTRAMUSCULAR | Status: AC
  Filled 2022-08-11: qty 1

## 2022-08-11 MED ORDER — ACETAMINOPHEN 325 MG RE SUPP: 650.0000 mg | RECTAL | Status: DC | PRN

## 2022-08-11 MED ORDER — LIDOCAINE HCL (PF) 2 % IJ SOLN
INTRAMUSCULAR | Status: AC
  Filled 2022-08-11: qty 5

## 2022-08-11 MED ORDER — OXYCODONE HCL 5 MG/5ML PO SOLN: 5.0000 mg | Freq: Once | ORAL | Status: AC | PRN

## 2022-08-11 MED ORDER — OXYCODONE HCL 5 MG PO TABS
5.0000 mg | ORAL_TABLET | Freq: Once | ORAL | Status: AC | PRN
  Administered 2022-08-11: 5 mg via ORAL

## 2022-08-11 MED ORDER — PROPOFOL 10 MG/ML IV BOLUS
INTRAVENOUS | Status: DC | PRN
  Administered 2022-08-11: 200 mg via INTRAVENOUS
  Administered 2022-08-11: 100 mg via INTRAVENOUS

## 2022-08-11 MED ORDER — SODIUM CHLORIDE 0.9% FLUSH: 3.0000 mL | Freq: Two times a day (BID) | INTRAVENOUS | Status: DC

## 2022-08-11 MED ORDER — SODIUM CHLORIDE 0.9 % IR SOLN
Status: DC | PRN
  Administered 2022-08-11: 3000 mL

## 2022-08-11 MED ORDER — DEXAMETHASONE SODIUM PHOSPHATE 10 MG/ML IJ SOLN
INTRAMUSCULAR | Status: AC
  Filled 2022-08-11: qty 1

## 2022-08-11 MED ORDER — PROPOFOL 10 MG/ML IV BOLUS
INTRAVENOUS | Status: AC
  Filled 2022-08-11: qty 20

## 2022-08-11 MED ORDER — LACTATED RINGERS IV SOLN: INTRAVENOUS | Status: DC

## 2022-08-11 MED ORDER — ONDANSETRON HCL 4 MG/2ML IJ SOLN
INTRAMUSCULAR | Status: AC
  Filled 2022-08-11: qty 2

## 2022-08-11 MED ORDER — CEFAZOLIN SODIUM-DEXTROSE 2-4 GM/100ML-% IV SOLN
INTRAVENOUS | Status: AC
  Filled 2022-08-11: qty 100

## 2022-08-11 MED ORDER — OXYCODONE HCL 5 MG PO TABS: 5.0000 mg | ORAL_TABLET | ORAL | Status: DC | PRN

## 2022-08-11 MED ORDER — LIDOCAINE 2% (20 MG/ML) 5 ML SYRINGE
INTRAMUSCULAR | Status: DC | PRN
  Administered 2022-08-11: 100 mg via INTRAVENOUS

## 2022-08-11 MED ORDER — ONDANSETRON HCL 4 MG/2ML IJ SOLN
INTRAMUSCULAR | Status: DC | PRN
  Administered 2022-08-11: 4 mg via INTRAVENOUS

## 2022-08-11 MED ORDER — MIDAZOLAM HCL 2 MG/2ML IJ SOLN
INTRAMUSCULAR | Status: AC
  Filled 2022-08-11: qty 2

## 2022-08-11 MED ORDER — ONDANSETRON HCL 4 MG/2ML IJ SOLN: 4.0000 mg | Freq: Once | INTRAMUSCULAR | Status: DC | PRN

## 2022-08-11 SURGICAL SUPPLY — 20 items
BAG DRAIN URO-CYSTO SKYTR STRL (DRAIN) ×2 IMPLANT
BAG DRN UROCATH (DRAIN) ×2
BASKET STONE 1.7 NGAGE (UROLOGICAL SUPPLIES) ×1 IMPLANT
CATH URET 5FR 28IN OPEN ENDED (CATHETERS) ×1 IMPLANT
CLOTH BEACON ORANGE TIMEOUT ST (SAFETY) ×2 IMPLANT
GLOVE SURG SS PI 6.5 STRL IVOR (GLOVE) ×2 IMPLANT
GLOVE SURG SS PI 8.0 STRL IVOR (GLOVE) ×2 IMPLANT
GOWN STRL REUS W/ TWL LRG LVL3 (GOWN DISPOSABLE) ×1 IMPLANT
GOWN STRL REUS W/TWL LRG LVL3 (GOWN DISPOSABLE) ×2
GOWN STRL REUS W/TWL XL LVL3 (GOWN DISPOSABLE) ×2 IMPLANT
GUIDEWIRE ANG ZIPWIRE 038X150 (WIRE) ×1 IMPLANT
GUIDEWIRE STR DUAL SENSOR (WIRE) ×2 IMPLANT
IV NS IRRIG 3000ML ARTHROMATIC (IV SOLUTION) ×2 IMPLANT
KIT TURNOVER CYSTO (KITS) ×2 IMPLANT
MANIFOLD NEPTUNE II (INSTRUMENTS) ×2 IMPLANT
PACK CYSTO (CUSTOM PROCEDURE TRAY) ×2 IMPLANT
SHEATH NAVIGATOR HD 11/13X36 (SHEATH) ×1 IMPLANT
STENT URET 6FRX24 CONTOUR (STENTS) ×2 IMPLANT
TUBE CONNECTING 12X1/4 (SUCTIONS) ×2 IMPLANT
TUBING UROLOGY SET (TUBING) ×1 IMPLANT

## 2022-08-11 NOTE — H&P (Signed)
I have ureteral stone.  HPI: Hannah Cummings is a 26 year-old female established patient who is here for ureteral stone.    Hannah Cummings is a 26 yo female who was seen in the ER on 10/10 with left flank pain and she was seen in the ER where she was found to have a 3mm left proximal stone and a 3mm right renal stone. Both were in the kidney in 4/23. She had improvement in the left side and she has some bladder pressure and urgency. She had the onset yesterday of right flank pain. She had had hematuria x 1 3 days after her ER visit. She has had nausea with pain med and no fever. She has had no prior GU surgery. She has had only one prior UTI.   08/03/2022:  Patient returns to clinic for follow-up on her 3 mm obstructing proximal left ureteral stone. She continues on tamsulosin, and has Zofran and oxy by ER. She has not noticed passage of stone material. Today, she remains at baseline urinary function, except for intermittent gross hematuria. She complains of continued left-sided pain with new onset right-sided flank pain. Her pain waxes and wanes, but is frequent, and often only mildly improved on Oxy or alternating Tylenol and ibuprofen, or intractable. In the interim, her PCP prescribed her oral Dilaudid, which has not significantly improved her pain either. Patient states that she has now lost 8 pounds due to lack of appetite since initial diagnosis, and is unable to sleep more than a couple hours due to recurrent pain. She has missed both work and school. Her pain is most improved on Oxy, but she is nauseous on it, and prefers NSAIDs. She is anxious and tearful today, and is unable to sit up for extended periods of time. She is most comfortable laying down. She would strongly like to pursue definitive treatment at this time.     ALLERGIES: None   MEDICATIONS: Birth Control     GU PSH: No GU PSH      PSH Notes: Removal of Ganglion cyst   NON-GU PSH: Ankle Arthroscopy/surgery Breast Reduction Foot  surgery (unspecified)     GU PMH: Flank Pain - 07/21/2022 Renal calculus, The right flank pain doesn't appear to be from the stone in the RLP and could be musculoskeletal possible related to the reaction to the prior stone particularly if she had nausea and vomiting. I have recommended NSAID. - 07/21/2022 Ureteral calculus, Her symptoms suggest the stone has moved distally on the left but there is not significant obstruction. I will have her continue to strain her urine and return in 2 weeks. - 07/21/2022    NON-GU PMH: No Non-GU PMH    Immunizations: None   FAMILY HISTORY: No Family History    SOCIAL HISTORY: Marital Status: Single Preferred Language: English; Ethnicity: Not Hispanic Or Latino; Race: White Current Smoking Status: Patient has never smoked.   Tobacco Use Assessment Completed: Used Tobacco in last 30 days? Does not use smokeless tobacco. Has never drank.  Does not drink caffeine. 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    REVIEW OF SYSTEMS:    GU Review Female:   Patient denies frequent urination, hard to postpone urination, burning /pain with urination, get up at night to urinate, leakage of urine, stream starts and stops, trouble starting your stream, have to strain to urinate, and being pregnant.  Gastrointestinal (Upper):   Patient denies nausea, vomiting, and indigestion/ heartburn.  Gastrointestinal (Lower):   Patient denies diarrhea and constipation.    Constitutional:   Patient denies fever, night sweats, weight loss, and fatigue.  Skin:   Patient denies skin rash/ lesion and itching.  Eyes:   Patient denies blurred vision and double vision.  Ears/ Nose/ Throat:   Patient denies sore throat and sinus problems.  Hematologic/Lymphatic:   Patient denies swollen glands and easy bruising.  Cardiovascular:   Patient denies leg swelling and chest pains.  Respiratory:   Patient denies shortness of breath and cough.  Endocrine:   Patient denies excessive thirst.   Musculoskeletal:   Patient denies back pain and joint pain.  Neurological:   Patient denies headaches and dizziness.  Psychologic:   Patient denies depression and anxiety.   VITAL SIGNS:      08/03/2022 01:24 PM  Weight 140 lb / 63.5 kg  Height 63 in / 160.02 cm  BP 115/73 mmHg  Pulse 90 /min  Temperature 97.8 F / 36.5 C  BMI 24.8 kg/m   MULTI-SYSTEM PHYSICAL EXAMINATION:    Constitutional: Well-nourished. No physical deformities. Normally developed. Good grooming. Patient is tearful and anxious. She appears in pain, and is most comfortable laying down.  Neck: Neck symmetrical, not swollen. Normal tracheal position.  Respiratory: No labored breathing, no use of accessory muscles.   Cardiovascular: Normal temperature, normal extremity pulses, no swelling.   Skin: No paleness, no jaundice, no cyanosis.   Neurologic / Psychiatric: Oriented to time, oriented to place, oriented to person. No depression, no anxiety, no agitation.  Gastrointestinal: Bilateral CVA tenderness. No mass, no suprapubic tenderness, no rigidity, non obese abdomen.   Musculoskeletal: Normal gait and station of head and neck.     Complexity of Data:  Source Of History:  Patient, Family/Caregiver, Medical Record Summary  Records Review:   Previous Doctor Records, Previous Hospital Records, Previous Patient Records  Urine Test Review:   Urinalysis  X-Ray Review: KUB: Reviewed Films. Discussed With Patient.  Renal Ultrasound: Reviewed Films. Reviewed Report. Discussed With Patient.  C.T. Stone Protocol: Reviewed Films. Reviewed Report. Discussed With Patient.     08/03/22  Urinalysis  Urine Appearance Slightly Cloudy   Urine Color Yellow   Urine Glucose Neg mg/dL  Urine Bilirubin Neg mg/dL  Urine Ketones Neg mg/dL  Urine Specific Gravity 1.020   Urine Blood 2+ ery/uL  Urine pH 6.0   Urine Protein Neg mg/dL  Urine Urobilinogen 0.2 mg/dL  Urine Nitrites Neg   Urine Leukocyte Esterase Neg leu/uL  Urine  WBC/hpf 0 - 5/hpf   Urine RBC/hpf 3 - 10/hpf   Urine Epithelial Cells 6 - 10/hpf   Urine Bacteria Rare (0-9/hpf)   Urine Mucous Not Present   Urine Yeast NS (Not Seen)   Urine Trichomonas Not Present   Urine Cystals NS (Not Seen)   Urine Casts NS (Not Seen)   Urine Sperm Not Present   Notes:                     07/11/2022 - CLINICAL DATA: Left lower quadrant abdominal pain, flank pain   EXAM:  CT ABDOMEN AND PELVIS WITHOUT CONTRAST   TECHNIQUE:  Multidetector CT imaging of the abdomen and pelvis was performed  following the standard protocol without IV contrast.   RADIATION DOSE REDUCTION: This exam was performed according to the  departmental dose-optimization program which includes automated  exposure control, adjustment of the mA and/or kV according to  patient size and/or use of iterative reconstruction technique.   COMPARISON: CT January 03, 2022 and pelvic as well   as renal ultrasound  dated July 11, 2022.   FINDINGS:  Lower chest: No acute abnormality   Hepatobiliary: Unremarkable noncontrast enhanced appearance of the  hepatic parenchyma. Gallbladder is unremarkable. No biliary ductal  dilation.   Pancreas: No pancreatic ductal dilation or evidence of acute  inflammation.   Spleen: No splenomegaly.   Adrenals/Urinary Tract: Bilateral adrenal glands appear normal. Mild  left hydronephrosis secondary to a 3 mm stone in the proximal ureter  at the L3 vertebral body level. Nonobstructive 3 mm right interpolar  renal calculus. Urinary bladder is unremarkable for degree of  distension.   Stomach/Bowel: Stomach is unremarkable for degree of distension. No  pathologic dilation of small or large bowel. Normal appendix and  terminal ileum. No evidence of acute bowel inflammation.   Vascular/Lymphatic: Normal caliber abdominal aorta. No  pathologically enlarged abdominal or pelvic lymph nodes   Reproductive: Uterus and bilateral adnexa are unremarkable.   Other:  Trace pelvic free fluid is within physiologic normal limits.   Musculoskeletal: No acute or significant osseous findings.   IMPRESSION:  1. Mild left hydronephrosis secondary to a 3 mm stone in the  proximal ureter at the L3 vertebral body level.  2. Nonobstructive 3 mm right interpolar renal calculus.    Electronically Signed  By: Maudry Mayhew M.D.  On: 07/11/2022 10:18     PROCEDURES:         KUB - 74018  A single view of the abdomen is obtained. Sensitivity of study severely limited by diffuse and dominant bowel gas and stool patterns. Bilateral renal shadows poorly observed without noted calcifications. The expected anatomical tract of bilateral ureters grossly clear. Pelvic inlet poorly visualized, without overt evidence of calculi.      Patient confirmed No Neulasta OnPro Device.            Renal Ultrasound - 16109  Right Kidney: Length: 10.26cm Depth: 3.90 cm Cortical Width: 1.10 cm Width: 5.28 cm  Left Kidney: Length: 10.49 cm Depth: 3.59 cm Cortical Width: 1.08 cm Width: 4.32 cm  Left Kidney/Ureter:  Appearance of Fullness  Right Kidney/Ureter:  0.30cm Lower Pole Stone  Bladder:  PVR= 70.31ml-----? 0.44cm Hyperechoic Area      Patient confirmed No Neulasta OnPro Device.           Urinalysis w/Scope Dipstick Dipstick Cont'd Micro  Color: Yellow Bilirubin: Neg mg/dL WBC/hpf: 0 - 5/hpf  Appearance: Slightly Cloudy Ketones: Neg mg/dL RBC/hpf: 3 - 60/AVW  Specific Gravity: 1.020 Blood: 2+ ery/uL Bacteria: Rare (0-9/hpf)  pH: 6.0 Protein: Neg mg/dL Cystals: NS (Not Seen)  Glucose: Neg mg/dL Urobilinogen: 0.2 mg/dL Casts: NS (Not Seen)    Nitrites: Neg Trichomonas: Not Present    Leukocyte Esterase: Neg leu/uL Mucous: Not Present      Epithelial Cells: 6 - 10/hpf      Yeast: NS (Not Seen)      Sperm: Not Present         Ketoralac 60mg  - , U9811 Qty: 60 Adm. By: [id number]  Unit: mg Lot No Andree Moro  Route: IM Exp. Date 12/01/2022  Freq: None  Mfgr.:   Site: Right Hip   ASSESSMENT:      ICD-10 Details  1 GU:   Flank Pain - R10.84 Bilateral, Acute, Uncomplicated  2   Ureteral calculus - N20.1 Left, Acute, Uncomplicated  3   Renal calculus - N20.0 Right, Acute, Uncomplicated   PLAN:            Medications New Meds: Ketorolac  Tromethamine 10 mg tablet 1 tablet PO QID PRN   #20  0 Refill(s)  Tamsulosin Hcl 0.4 mg capsule 1 capsule PO Daily   #30  1 Refill(s)  Pharmacy Name:  [id number]  Address:  [address]   [place], [place] [id number]  Phone:  [phone]  Fax:  [phone]            Orders Labs Urine Preg.(Stat)  X-Rays: C.T. Stone Protocol Without I.V. Contrast    KUB    Renal Ultrasound  X-Ray Notes: History:  Hematuria: Yes/No  Patient to see MD after exam: Yes/No  Previous exam: CT / IVP/ US/ KUB/ None  When:  Where:  Diabetic: Yes/ No  BUN/ Creatinine:  Date of last BUN Creatinine:  Weight in pounds:  Allergy- IV Contrast: Yes/ No  Conflicting diabetic meds: Yes/ No  Diabetic Meds:  Prior Authorization #: [facility] Auth #             Schedule Return Visit/Planned Activity: Other See Visit Notes - Schedule Surgery          Document Letter(s):  Created for Chart: School Excuse   Created for Patient: Clinical Summary         Notes:   Today, UA with hematuria and congruent with retained ureteral stone. With noted prior left-sided hydronephrosis and new onset of right-sided flank pain, I had strong concerns for bilateral obstruction. KUB today inconclusive. I subsequently attempted a CTSS, however, patient's insurance required a pre-CERT, yet was unable to provide urgent and timely follow-up for approval. After much delay, I ultimately decided to proceed with bilateral renal ultrasounds to evaluate for urgent need for stenting if bilaterally obstructed. Renal ultrasound with stable right lower pole nonobstructing 3 mm stone as seen on prior imaging. Left  kidney also demonstrated resolving hydronephrosis. Ultimately, CT was abandoned, as patient was no longer determined to be in need of urgent stenting.   We will proceed with scheduling for left-sided ureteroscopy per patient request. Patient would prefer to be stone free. However I counseled that her right lower pole stone is noted stable, nonobstructing, and small in size. Right-sided ureteroscopy may present more harm than benefit going forward. Nonetheless, I will consult with her urologist and follow-up with patient based on given recommendations. For now, I will proceed with submitting a surgical posting sheet for left-sided ureteroscopy. Surgery scheduler will follow-up with patient to schedule at the earliest available time appropriate for all parties. Continue tamsulosin and liberal hydration. Return to clinic or present to ED precautions given for worsening obstructive, irritative, or constitutional symptomologies. Patient voiced understanding and is amenable to this plan.   With ureteroscopy, I did review associated risks with surgery, not limited to the following: complications with or following sedation, potential damage to urinary tract with instrumentation, intolerance of stent postoperatively, risk for infection, need for staged procedure, or conversion to inpatient procedure with hospitalization. I answered all questions to the best of my abilities. Posting sheet completed and submitted to scheduler.    

## 2022-08-11 NOTE — Anesthesia Postprocedure Evaluation (Signed)
Anesthesia Post Note  Patient: Advice worker  Procedure(s) Performed: CYSTOSCOPY WITH RETROGRADE PYELOGRAM, URETEROSCOPY AND STENT PLACEMENT (Bilateral: Urethra)     Patient location during evaluation: PACU Anesthesia Type: General Level of consciousness: awake and alert and oriented Pain management: pain level controlled Vital Signs Assessment: post-procedure vital signs reviewed and stable Respiratory status: spontaneous breathing, nonlabored ventilation and respiratory function stable Cardiovascular status: blood pressure returned to baseline and stable Postop Assessment: no apparent nausea or vomiting Anesthetic complications: no   No notable events documented.  Last Vitals:  Vitals:   08/11/22 1015 08/11/22 1030  BP: 106/62 105/70  Pulse: 66 76  Resp: 18 12  Temp:    SpO2: 100% 95%    Last Pain:  Vitals:   08/11/22 1015  TempSrc:   PainSc: 6                  Jozalynn Noyce A.

## 2022-08-11 NOTE — Discharge Instructions (Addendum)
You had an short strictured area in the left upper ureter and there was some debris but no obvious stone was noted.  I left a stent to allow the ureter to dilate.  Your right ureter was narrow along it's length and I was unable to get to the kidney with the scope because I couldn't pass my dilator beyond the narrowed area.   I left a stent so that the ureter will dilate up.    I will need to go back in the next week or two after the ureters have had a chance to dilate so that I can get the right sided stone.    Post Anesthesia Home Care Instructions  Activity: Get plenty of rest for the remainder of the day. A responsible individual must stay with you for 24 hours following the procedure.  For the next 24 hours, DO NOT: -Drive a car -Advertising copywriter -Drink alcoholic beverages -Take any medication unless instructed by your physician -Make any legal decisions or sign important papers.  Meals: Start with liquid foods such as gelatin or soup. Progress to regular foods as tolerated. Avoid greasy, spicy, heavy foods. If nausea and/or vomiting occur, drink only clear liquids until the nausea and/or vomiting subsides. Call your physician if vomiting continues.  Special Instructions/Symptoms: Your throat may feel dry or sore from the anesthesia or the breathing tube placed in your throat during surgery. If this causes discomfort, gargle with warm salt water. The discomfort should disappear within 24 hours.

## 2022-08-11 NOTE — Anesthesia Procedure Notes (Signed)
Procedure Name: LMA Insertion Date/Time: 08/11/2022 8:55 AM  Performed by: Jessica Priest, CRNAPre-anesthesia Checklist: Patient identified, Emergency Drugs available, Suction available, Patient being monitored and Timeout performed Patient Re-evaluated:Patient Re-evaluated prior to induction Oxygen Delivery Method: Circle system utilized Preoxygenation: Pre-oxygenation with 100% oxygen Induction Type: IV induction Ventilation: Mask ventilation without difficulty LMA: LMA inserted LMA Size: 4.0 Number of attempts: 1 Airway Equipment and Method: Bite block Placement Confirmation: positive ETCO2, breath sounds checked- equal and bilateral and CO2 detector Tube secured with: Tape Dental Injury: Teeth and Oropharynx as per pre-operative assessment

## 2022-08-11 NOTE — Op Note (Signed)
Procedure: 1.  Cystoscopy with bilateral retrograde pyelograms and interpretation. 2.  Bilateral ureteroscopy. 3.  Cystoscopy with insertion of bilateral double-J stents. 4.  Application of fluoroscopy.  Preop diagnosis: 2 to 3 mm left proximal ureteral stone and 3 mm right renal stone.  Postop diagnosis: Bilateral ureteral strictures.  The proximal ureteral stone with soft debris and could not be retrieved.  Surgeon: Dr. Bjorn Pippin.  Anesthesia: General.  Specimen: None.  Drains: Bilateral 6 French by 24 cm contour double-J stent.  EBL: None.  Complications: None.  Indications: The patient is a 26 year old female who was then found to have a 2 to 3 mm left proximal ureteral stone and 3 mm renal stone she initially had left-sided pain that has persisted for the last few weeks and now has some right-sided pain as well.  Her stones were not readily visible on KUB but she had residual fullness in the left kidney on recent ultrasound.  She is elected undergo bilateral ureteroscopy.  A KUB prior to procedure today once again did not reveal the location of the stones.  Procedure: She was taken operating room where she was given Ancef.  A general anesthetic was induced.  She was placed in lithotomy position and fitted with PAS hose.  Her perineum and genitalia were prepped Betadine solution she was draped in usual sterile fashion.  Cystoscopy was performed using a 21 [place] scope and 30 degree lens.  Examination revealed a normal bladder wall.  Ureteral orifice ease were unremarkable.  Urethra was unremarkable.  The left ureteral orifice was cannulated with a 5 [place] open-ended catheter and contrast was instilled.  The left retrograde pyelogram demonstrated a delicate but otherwise normal ureter with a possible small filling defect in the renal pelvis with only mild blunting of the calyces.  The right ureteral orifice was cannulated with 5 [place] open-ended catheter and contrast was  instilled.  The right retrograde pyelogram revealed a delicate but otherwise normal-appearing ureter with no obvious filling defects in the ureter or the renal collecting system which is delicate as well.  A sensor wire was advanced to the left kidney and the cystoscope was removed.  The 11 French inner core of 36 cm digital access sheath was advanced over the wire with only mild resistance to the proximal ureter.  The single-lumen digital flexible scope was then advanced over the wire into the ureter and the wire was removed.  In the proximal ureter there was a small brownish soft appearing possible stone that measured about 2 mm in size but I was unable to capture it as it flushed out of site and was not found again.  She did appear to have a small strictured area at the UPJ suggestive of a site of stone impaction.  The sensor wire was passed back through the ureteroscope to allow negotiation across this area.  Kidney inspection of the calyceal system demonstrated no significant stones.  There was a 1 mm calcification on the Randall's plaque in the lower pole but it was not a position that could be retrieved.  The scope was then removed over the wire with inspection of the entire ureter and once again the small stone material could not be identified.  The cystoscope was then reinserted over the wire and a 6 [place] by 24 cm contour double-J stent without tether was passed the kidney under fluoroscopic guidance.  The wire was removed and a good coil in the kidney and a good coil in the bladder.  I then passed a sensor wire to the right kidney and remove the scope.  An initial attempt was made to pass the single-lumen ureteroscope without ureteral dilation but it would not pass the meatus.  I then used the 11 [place] inner core but could get it no more than 2 to 3 cm proximal to the meatus due to the ureteral caliber.  I did attempt to insert the scope again and was able to get it into the distal ureter but  was not able to advance it beyond that point due to significant ureteral narrowing.  The wire was then replaced to the kidney and the cystoscope was inserted over the wire.  A fresh 6 [place] by 24 cm contour double-J stent without tether was then advanced the kidney under fluoroscopic guidance there was considerable resistance to advancing the stent but I was able to successfully get it in good position.  The wire was removed, good coil in the kidney and a good coil in the bladder.  The bladder was drained and the scope was removed.  She was taken down for lithotomy position, her anesthetic was reversed and she was moved to recovery in stable condition.  There were no complications.  Because of the ureteral narrowing particular on the right I felt leaving the stent without tether was important so that they could be left in for 7 to 10 days so I can bring her back and make another attempt at removing the right renal stone because with her ureteral anatomy she will not be able to pass stones and I can also make a second look into the left kidney to ensure that no residual stone material remains.

## 2022-08-11 NOTE — H&P (View-Only) (Signed)
I have ureteral stone.  HPI: Hannah Cummings is a 26 year-old female established patient who is here for ureteral stone.    Hannah Cummings is a 26 yo female who was seen in the ER on 10/10 with left flank pain and she was seen in the ER where she was found to have a 69mm left proximal stone and a 12mm right renal stone. Both were in the kidney in 4/23. She had improvement in the left side and she has some bladder pressure and urgency. She had the onset yesterday of right flank pain. She had had hematuria x 1 3 days after her ER visit. She has had nausea with pain med and no fever. She has had no prior GU surgery. She has had only one prior UTI.   08/03/2022:  Patient returns to clinic for follow-up on her 3 mm obstructing proximal left ureteral stone. She continues on tamsulosin, and has Zofran and oxy by ER. She has not noticed passage of stone material. Today, she remains at baseline urinary function, except for intermittent gross hematuria. She complains of continued left-sided pain with new onset right-sided flank pain. Her pain waxes and wanes, but is frequent, and often only mildly improved on Oxy or alternating Tylenol and ibuprofen, or intractable. In the interim, her PCP prescribed her oral Dilaudid, which has not significantly improved her pain either. Patient states that she has now lost 8 pounds due to lack of appetite since initial diagnosis, and is unable to sleep more than a couple hours due to recurrent pain. She has missed both work and school. Her pain is most improved on Oxy, but she is nauseous on it, and prefers NSAIDs. She is anxious and tearful today, and is unable to sit up for extended periods of time. She is most comfortable laying down. She would strongly like to pursue definitive treatment at this time.     ALLERGIES: None   MEDICATIONS: Birth Control     GU PSH: No GU PSH      PSH Notes: Removal of Ganglion cyst   NON-GU PSH: Ankle Arthroscopy/surgery Breast Reduction Foot  surgery (unspecified)     GU PMH: Flank Pain - 07/21/2022 Renal calculus, The right flank pain doesn't appear to be from the stone in the RLP and could be musculoskeletal possible related to the reaction to the prior stone particularly if she had nausea and vomiting. I have recommended NSAID. - 07/21/2022 Ureteral calculus, Her symptoms suggest the stone has moved distally on the left but there is not significant obstruction. I will have her continue to strain her urine and return in 2 weeks. - 07/21/2022    NON-GU PMH: No Non-GU PMH    Immunizations: None   FAMILY HISTORY: No Family History    SOCIAL HISTORY: Marital Status: Single Preferred Language: English; Ethnicity: Not Hispanic Or Latino; Race: White Current Smoking Status: Patient has never smoked.   Tobacco Use Assessment Completed: Used Tobacco in last 30 days? Does not use smokeless tobacco. Has never drank.  Does not drink caffeine. 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    REVIEW OF SYSTEMS:    GU Review Female:   Patient denies frequent urination, hard to postpone urination, burning /pain with urination, get up at night to urinate, leakage of urine, stream starts and stops, trouble starting your stream, have to strain to urinate, and being pregnant.  Gastrointestinal (Upper):   Patient denies nausea, vomiting, and indigestion/ heartburn.  Gastrointestinal (Lower):   Patient denies diarrhea and constipation.  Constitutional:   Patient denies fever, night sweats, weight loss, and fatigue.  Skin:   Patient denies skin rash/ lesion and itching.  Eyes:   Patient denies blurred vision and double vision.  Ears/ Nose/ Throat:   Patient denies sore throat and sinus problems.  Hematologic/Lymphatic:   Patient denies swollen glands and easy bruising.  Cardiovascular:   Patient denies leg swelling and chest pains.  Respiratory:   Patient denies shortness of breath and cough.  Endocrine:   Patient denies excessive thirst.   Musculoskeletal:   Patient denies back pain and joint pain.  Neurological:   Patient denies headaches and dizziness.  Psychologic:   Patient denies depression and anxiety.   VITAL SIGNS:      08/03/2022 01:24 PM  Weight 140 lb / 63.5 kg  Height 63 in / 160.02 cm  BP 115/73 mmHg  Pulse 90 /min  Temperature 97.8 F / 36.5 C  BMI 24.8 kg/m   MULTI-SYSTEM PHYSICAL EXAMINATION:    Constitutional: Well-nourished. No physical deformities. Normally developed. Good grooming. Patient is tearful and anxious. She appears in pain, and is most comfortable laying down.  Neck: Neck symmetrical, not swollen. Normal tracheal position.  Respiratory: No labored breathing, no use of accessory muscles.   Cardiovascular: Normal temperature, normal extremity pulses, no swelling.   Skin: No paleness, no jaundice, no cyanosis.   Neurologic / Psychiatric: Oriented to time, oriented to place, oriented to person. No depression, no anxiety, no agitation.  Gastrointestinal: Bilateral CVA tenderness. No mass, no suprapubic tenderness, no rigidity, non obese abdomen.   Musculoskeletal: Normal gait and station of head and neck.     Complexity of Data:  Source Of History:  Patient, Family/Caregiver, Medical Record Summary  Records Review:   Previous Doctor Records, Previous Hospital Records, Previous Patient Records  Urine Test Review:   Urinalysis  X-Ray Review: KUB: Reviewed Films. Discussed With Patient.  Renal Ultrasound: Reviewed Films. Reviewed Report. Discussed With Patient.  C.T. Stone Protocol: Reviewed Films. Reviewed Report. Discussed With Patient.     08/03/22  Urinalysis  Urine Appearance Slightly Cloudy   Urine Color Yellow   Urine Glucose Neg mg/dL  Urine Bilirubin Neg mg/dL  Urine Ketones Neg mg/dL  Urine Specific Gravity 1.020   Urine Blood 2+ ery/uL  Urine pH 6.0   Urine Protein Neg mg/dL  Urine Urobilinogen 0.2 mg/dL  Urine Nitrites Neg   Urine Leukocyte Esterase Neg leu/uL  Urine  WBC/hpf 0 - 5/hpf   Urine RBC/hpf 3 - 10/hpf   Urine Epithelial Cells 6 - 10/hpf   Urine Bacteria Rare (0-9/hpf)   Urine Mucous Not Present   Urine Yeast NS (Not Seen)   Urine Trichomonas Not Present   Urine Cystals NS (Not Seen)   Urine Casts NS (Not Seen)   Urine Sperm Not Present   Notes:                     07/11/2022 - CLINICAL DATA: Left lower quadrant abdominal pain, flank pain   EXAM:  CT ABDOMEN AND PELVIS WITHOUT CONTRAST   TECHNIQUE:  Multidetector CT imaging of the abdomen and pelvis was performed  following the standard protocol without IV contrast.   RADIATION DOSE REDUCTION: This exam was performed according to the  departmental dose-optimization program which includes automated  exposure control, adjustment of the mA and/or kV according to  patient size and/or use of iterative reconstruction technique.   COMPARISON: CT January 03, 2022 and pelvic as well  as renal ultrasound  dated July 11, 2022.   FINDINGS:  Lower chest: No acute abnormality   Hepatobiliary: Unremarkable noncontrast enhanced appearance of the  hepatic parenchyma. Gallbladder is unremarkable. No biliary ductal  dilation.   Pancreas: No pancreatic ductal dilation or evidence of acute  inflammation.   Spleen: No splenomegaly.   Adrenals/Urinary Tract: Bilateral adrenal glands appear normal. Mild  left hydronephrosis secondary to a 3 mm stone in the proximal ureter  at the L3 vertebral body level. Nonobstructive 3 mm right interpolar  renal calculus. Urinary bladder is unremarkable for degree of  distension.   Stomach/Bowel: Stomach is unremarkable for degree of distension. No  pathologic dilation of small or large bowel. Normal appendix and  terminal ileum. No evidence of acute bowel inflammation.   Vascular/Lymphatic: Normal caliber abdominal aorta. No  pathologically enlarged abdominal or pelvic lymph nodes   Reproductive: Uterus and bilateral adnexa are unremarkable.   Other:  Trace pelvic free fluid is within physiologic normal limits.   Musculoskeletal: No acute or significant osseous findings.   IMPRESSION:  1. Mild left hydronephrosis secondary to a 3 mm stone in the  proximal ureter at the L3 vertebral body level.  2. Nonobstructive 3 mm right interpolar renal calculus.    Electronically Signed  By: Maudry Mayhew M.D.  On: 07/11/2022 10:18     PROCEDURES:         KUB - 74018  A single view of the abdomen is obtained. Sensitivity of study severely limited by diffuse and dominant bowel gas and stool patterns. Bilateral renal shadows poorly observed without noted calcifications. The expected anatomical tract of bilateral ureters grossly clear. Pelvic inlet poorly visualized, without overt evidence of calculi.      Patient confirmed No Neulasta OnPro Device.            Renal Ultrasound - 16109  Right Kidney: Length: 10.26cm Depth: 3.90 cm Cortical Width: 1.10 cm Width: 5.28 cm  Left Kidney: Length: 10.49 cm Depth: 3.59 cm Cortical Width: 1.08 cm Width: 4.32 cm  Left Kidney/Ureter:  Appearance of Fullness  Right Kidney/Ureter:  0.30cm Lower Pole Stone  Bladder:  PVR= 70.31ml-----? 0.44cm Hyperechoic Area      Patient confirmed No Neulasta OnPro Device.           Urinalysis w/Scope Dipstick Dipstick Cont'd Micro  Color: Yellow Bilirubin: Neg mg/dL WBC/hpf: 0 - 5/hpf  Appearance: Slightly Cloudy Ketones: Neg mg/dL RBC/hpf: 3 - 60/AVW  Specific Gravity: 1.020 Blood: 2+ ery/uL Bacteria: Rare (0-9/hpf)  pH: 6.0 Protein: Neg mg/dL Cystals: NS (Not Seen)  Glucose: Neg mg/dL Urobilinogen: 0.2 mg/dL Casts: NS (Not Seen)    Nitrites: Neg Trichomonas: Not Present    Leukocyte Esterase: Neg leu/uL Mucous: Not Present      Epithelial Cells: 6 - 10/hpf      Yeast: NS (Not Seen)      Sperm: Not Present         Ketoralac 60mg  - , U9811 Qty: 60 Adm. By: [id number]  Unit: mg Lot No Andree Moro  Route: IM Exp. Date 12/01/2022  Freq: None  Mfgr.:   Site: Right Hip   ASSESSMENT:      ICD-10 Details  1 GU:   Flank Pain - R10.84 Bilateral, Acute, Uncomplicated  2   Ureteral calculus - N20.1 Left, Acute, Uncomplicated  3   Renal calculus - N20.0 Right, Acute, Uncomplicated   PLAN:            Medications New Meds: Ketorolac  Tromethamine 10 mg tablet 1 tablet PO QID PRN   #20  0 Refill(s)  Tamsulosin Hcl 0.4 mg capsule 1 capsule PO Daily   #30  1 Refill(s)  Pharmacy Name:  [facility] [id number]  Address:  [address]   [place], [place] [id number]  Phone:  [phone]  Fax:  [phone]            Orders Labs Urine Preg.(Stat)  X-Rays: C.T. Stone Protocol Without I.V. Contrast    KUB    Renal Ultrasound  X-Ray Notes: History:  Hematuria: Yes/No  Patient to see MD after exam: Yes/No  Previous exam: CT / IVP/ US/ KUB/ None  When:  Where:  Diabetic: Yes/ No  BUN/ Creatinine:  Date of last BUN Creatinine:  Weight in pounds:  Allergy- IV Contrast: Yes/ No  Conflicting diabetic meds: Yes/ No  Diabetic Meds:  Prior Authorization #: Leroy #             Schedule Return Visit/Planned Activity: Other See Visit Notes - Schedule Surgery          Document Letter(s):  Created for Chart: School Excuse   Created for Patient: Clinical Summary         Notes:   Today, UA with hematuria and congruent with retained ureteral stone. With noted prior left-sided hydronephrosis and new onset of right-sided flank pain, I had strong concerns for bilateral obstruction. KUB today inconclusive. I subsequently attempted a CTSS, however, patient's insurance required a pre-CERT, yet was unable to provide urgent and timely follow-up for approval. After much delay, I ultimately decided to proceed with bilateral renal ultrasounds to evaluate for urgent need for stenting if bilaterally obstructed. Renal ultrasound with stable right lower pole nonobstructing 3 mm stone as seen on prior imaging. Left  kidney also demonstrated resolving hydronephrosis. Ultimately, CT was abandoned, as patient was no longer determined to be in need of urgent stenting.   We will proceed with scheduling for left-sided ureteroscopy per patient request. Patient would prefer to be stone free. However I counseled that her right lower pole stone is noted stable, nonobstructing, and small in size. Right-sided ureteroscopy may present more harm than benefit going forward. Nonetheless, I will consult with her urologist and follow-up with patient based on given recommendations. For now, I will proceed with submitting a surgical posting sheet for left-sided ureteroscopy. Surgery scheduler will follow-up with patient to schedule at the earliest available time appropriate for all parties. Continue tamsulosin and liberal hydration. Return to clinic or present to ED precautions given for worsening obstructive, irritative, or constitutional symptomologies. Patient voiced understanding and is amenable to this plan.   With ureteroscopy, I did review associated risks with surgery, not limited to the following: complications with or following sedation, potential damage to urinary tract with instrumentation, intolerance of stent postoperatively, risk for infection, need for staged procedure, or conversion to inpatient procedure with hospitalization. I answered all questions to the best of my abilities. Posting sheet completed and submitted to scheduler.

## 2022-08-11 NOTE — Transfer of Care (Signed)
Immediate Anesthesia Transfer of Care Note  Patient: Hannah Cummings  Procedure(s) Performed: Procedure(s) (LRB): CYSTOSCOPY WITH RETROGRADE PYELOGRAM, URETEROSCOPY AND STENT PLACEMENT (Bilateral)  Patient Location: PACU  Anesthesia Type: General  Level of Consciousness: awake, sedated, patient cooperative and responds to stimulation  Airway & Oxygen Therapy: Patient Spontanous Breathing and Patient connected to DISH oxygen  Post-op Assessment: Report given to PACU RN, Post -op Vital signs reviewed and stable and Patient moving all extremities  Post vital signs: Reviewed and stable  Complications: No apparent anesthesia complications

## 2022-08-14 ENCOUNTER — Other Ambulatory Visit: Payer: Self-pay | Admitting: Urology

## 2022-08-15 ENCOUNTER — Encounter (HOSPITAL_BASED_OUTPATIENT_CLINIC_OR_DEPARTMENT_OTHER): Payer: Self-pay | Admitting: Urology

## 2022-08-23 ENCOUNTER — Encounter (HOSPITAL_BASED_OUTPATIENT_CLINIC_OR_DEPARTMENT_OTHER): Payer: Self-pay | Admitting: Urology

## 2022-08-23 NOTE — Progress Notes (Signed)
Spoke w/ via phone for pre-op interview--- pt Lab needs dos---- urine preg              Lab results------ no COVID test -----patient states asymptomatic no test needed Arrive at ------- 0530 on 09-01-2022 NPO after MN NO Solid Food.  Clear liquids from MN until--- 0430 Med rec completed Medications to take morning of surgery ----- flomax Diabetic medication ----- n/a Patient instructed no nail polish to be worn day of surgery Patient instructed to bring photo id and insurance card day of surgery Patient aware to have Driver (ride ) / caregiver    for 24 hours after surgery -- mother, Hannah Cummings Patient Special Instructions ----- n/a Pre-Op special Istructions ----- n/a Patient verbalized understanding of instructions that were given at this phone interview. Patient denies shortness of breath, chest pain, fever, cough at this phone interview.

## 2022-08-31 NOTE — Anesthesia Preprocedure Evaluation (Addendum)
Anesthesia Evaluation  Patient identified by MRN, date of birth, ID band Patient awake    Reviewed: Allergy & Precautions, H&P , NPO status , Patient's Chart, lab work & pertinent test results  Airway Mallampati: II  TM Distance: >3 FB Neck ROM: Full    Dental no notable dental hx. (+) Teeth Intact, Dental Advisory Given   Pulmonary neg pulmonary ROS   Pulmonary exam normal breath sounds clear to auscultation       Cardiovascular negative cardio ROS Normal cardiovascular exam Rhythm:Regular Rate:Normal     Neuro/Psych negative neurological ROS  negative psych ROS   GI/Hepatic negative GI ROS, Neg liver ROS,,,  Endo/Other  negative endocrine ROS    Renal/GU Renal diseasenegative Renal ROSLab Results      Component                Value               Date                      CREATININE               0.88                07/11/2022              K                        3.2 (L)             07/11/2022                 negative genitourinary   Musculoskeletal negative musculoskeletal ROS (+)    Abdominal   Peds negative pediatric ROS (+)  Hematology negative hematology ROS (+) Lab Results      Component                Value               Date                       HGB                      13.1                07/11/2022                HCT                      38.1                07/11/2022                PLT                      280                 07/11/2022              Anesthesia Other Findings   Reproductive/Obstetrics negative OB ROS                             Anesthesia Physical Anesthesia Plan  ASA: 2  Anesthesia Plan: General   Post-op Pain Management: Dilaudid IV and Ofirmev IV (intra-op)*   Induction: Intravenous  PONV Risk Score  and Plan: 4 or greater and Treatment may vary due to age or medical condition, Midazolam and Ondansetron  Airway Management Planned:  LMA  Additional Equipment: None  Intra-op Plan:   Post-operative Plan: Extubation in OR  Informed Consent: I have reviewed the patients History and Physical, chart, labs and discussed the procedure including the risks, benefits and alternatives for the proposed anesthesia with the patient or authorized representative who has indicated his/her understanding and acceptance.     Dental advisory given  Plan Discussed with: CRNA, Anesthesiologist and Surgeon  Anesthesia Plan Comments:         Anesthesia Quick Evaluation

## 2022-09-01 ENCOUNTER — Ambulatory Visit (HOSPITAL_BASED_OUTPATIENT_CLINIC_OR_DEPARTMENT_OTHER)
Admission: RE | Admit: 2022-09-01 | Discharge: 2022-09-01 | Disposition: A | Discharge status: Home / Self Care | Payer: No Typology Code available for payment source | Attending: Urology | Admitting: Urology

## 2022-09-01 ENCOUNTER — Ambulatory Visit (HOSPITAL_BASED_OUTPATIENT_CLINIC_OR_DEPARTMENT_OTHER): Payer: No Typology Code available for payment source | Admitting: Anesthesiology

## 2022-09-01 ENCOUNTER — Encounter (HOSPITAL_BASED_OUTPATIENT_CLINIC_OR_DEPARTMENT_OTHER)
Admission: RE | Disposition: A | Discharge status: Home / Self Care | Payer: Self-pay | Source: Home / Self Care | Attending: Urology

## 2022-09-01 ENCOUNTER — Encounter (HOSPITAL_BASED_OUTPATIENT_CLINIC_OR_DEPARTMENT_OTHER): Payer: Self-pay | Admitting: Urology

## 2022-09-01 DIAGNOSIS — Z01818 Encounter for other preprocedural examination: Secondary | ICD-10-CM

## 2022-09-01 DIAGNOSIS — N202 Calculus of kidney with calculus of ureter: Secondary | ICD-10-CM

## 2022-09-01 DIAGNOSIS — Z87440 Personal history of urinary (tract) infections: Secondary | ICD-10-CM | POA: Diagnosis not present

## 2022-09-01 DIAGNOSIS — R310 Gross hematuria: Secondary | ICD-10-CM | POA: Diagnosis not present

## 2022-09-01 DIAGNOSIS — N132 Hydronephrosis with renal and ureteral calculous obstruction: Secondary | ICD-10-CM | POA: Diagnosis not present

## 2022-09-01 DIAGNOSIS — Z79899 Other long term (current) drug therapy: Secondary | ICD-10-CM | POA: Insufficient documentation

## 2022-09-01 DIAGNOSIS — N135 Crossing vessel and stricture of ureter without hydronephrosis: Secondary | ICD-10-CM

## 2022-09-01 HISTORY — DX: Calculus of kidney: N20.0

## 2022-09-01 HISTORY — DX: Calculus of ureter: N20.1

## 2022-09-01 HISTORY — PX: CYSTOSCOPY/URETEROSCOPY/HOLMIUM LASER/STENT PLACEMENT: SHX6546

## 2022-09-01 LAB — POCT PREGNANCY, URINE: Preg Test, Ur: NEGATIVE

## 2022-09-01 SURGERY — CYSTOSCOPY/URETEROSCOPY/HOLMIUM LASER/STENT PLACEMENT
Anesthesia: General | Laterality: Bilateral

## 2022-09-01 MED ORDER — ACETAMINOPHEN 500 MG PO TABS
ORAL_TABLET | ORAL | Status: AC
  Filled 2022-09-01: qty 2

## 2022-09-01 MED ORDER — CEFAZOLIN SODIUM-DEXTROSE 2-4 GM/100ML-% IV SOLN
INTRAVENOUS | Status: AC
  Filled 2022-09-01: qty 100

## 2022-09-01 MED ORDER — ACETAMINOPHEN 500 MG PO TABS
1000.0000 mg | ORAL_TABLET | Freq: Once | ORAL | Status: AC
  Administered 2022-09-01: 1000 mg via ORAL

## 2022-09-01 MED ORDER — OXYCODONE HCL 5 MG/5ML PO SOLN: 5.0000 mg | Freq: Once | ORAL | Status: AC | PRN

## 2022-09-01 MED ORDER — ONDANSETRON HCL 4 MG/2ML IJ SOLN: 4.0000 mg | Freq: Once | INTRAMUSCULAR | Status: DC | PRN

## 2022-09-01 MED ORDER — SODIUM CHLORIDE 0.9 % IR SOLN
Status: DC | PRN
  Administered 2022-09-01: 3000 mL via INTRAVESICAL

## 2022-09-01 MED ORDER — PROPOFOL 10 MG/ML IV BOLUS
INTRAVENOUS | Status: AC
  Filled 2022-09-01: qty 20

## 2022-09-01 MED ORDER — ONDANSETRON HCL 4 MG/2ML IJ SOLN
INTRAMUSCULAR | Status: DC | PRN
  Administered 2022-09-01: 4 mg via INTRAVENOUS

## 2022-09-01 MED ORDER — AMISULPRIDE (ANTIEMETIC) 5 MG/2ML IV SOLN: 10.0000 mg | Freq: Once | INTRAVENOUS | Status: DC | PRN

## 2022-09-01 MED ORDER — OXYCODONE HCL 5 MG PO TABS
5.0000 mg | ORAL_TABLET | Freq: Once | ORAL | Status: AC | PRN
  Administered 2022-09-01: 5 mg via ORAL

## 2022-09-01 MED ORDER — ONDANSETRON HCL 4 MG/2ML IJ SOLN
INTRAMUSCULAR | Status: AC
  Filled 2022-09-01: qty 2

## 2022-09-01 MED ORDER — LIDOCAINE HCL (PF) 2 % IJ SOLN
INTRAMUSCULAR | Status: AC
  Filled 2022-09-01: qty 5

## 2022-09-01 MED ORDER — SODIUM CHLORIDE 0.9 % IR SOLN
Status: DC | PRN
  Administered 2022-09-01: 1000 mL

## 2022-09-01 MED ORDER — FENTANYL CITRATE (PF) 100 MCG/2ML IJ SOLN
INTRAMUSCULAR | Status: DC | PRN
  Administered 2022-09-01 (×2): 50 ug via INTRAVENOUS

## 2022-09-01 MED ORDER — DEXAMETHASONE SODIUM PHOSPHATE 10 MG/ML IJ SOLN
INTRAMUSCULAR | Status: AC
  Filled 2022-09-01: qty 1

## 2022-09-01 MED ORDER — MIDAZOLAM HCL 2 MG/2ML IJ SOLN
INTRAMUSCULAR | Status: DC | PRN
  Administered 2022-09-01: 2 mg via INTRAVENOUS

## 2022-09-01 MED ORDER — HYDROMORPHONE HCL 1 MG/ML IJ SOLN
INTRAMUSCULAR | Status: DC | PRN
  Administered 2022-09-01 (×4): .5 mg via INTRAVENOUS

## 2022-09-01 MED ORDER — HYDROMORPHONE HCL 1 MG/ML IJ SOLN: 0.2500 mg | INTRAMUSCULAR | Status: DC | PRN

## 2022-09-01 MED ORDER — PROPOFOL 10 MG/ML IV BOLUS
INTRAVENOUS | Status: DC | PRN
  Administered 2022-09-01: 120 mg via INTRAVENOUS

## 2022-09-01 MED ORDER — HYDROMORPHONE HCL 2 MG/ML IJ SOLN
INTRAMUSCULAR | Status: AC
  Filled 2022-09-01: qty 1

## 2022-09-01 MED ORDER — KETOROLAC TROMETHAMINE 30 MG/ML IJ SOLN
INTRAMUSCULAR | Status: AC
  Filled 2022-09-01: qty 1

## 2022-09-01 MED ORDER — FENTANYL CITRATE (PF) 100 MCG/2ML IJ SOLN
INTRAMUSCULAR | Status: AC
  Filled 2022-09-01: qty 2

## 2022-09-01 MED ORDER — LIDOCAINE 2% (20 MG/ML) 5 ML SYRINGE
INTRAMUSCULAR | Status: DC | PRN
  Administered 2022-09-01: 60 mg via INTRAVENOUS

## 2022-09-01 MED ORDER — LACTATED RINGERS IV SOLN: INTRAVENOUS | Status: DC

## 2022-09-01 MED ORDER — ACETAMINOPHEN 10 MG/ML IV SOLN: 1000.0000 mg | Freq: Once | INTRAVENOUS | Status: DC | PRN

## 2022-09-01 MED ORDER — KETOROLAC TROMETHAMINE 30 MG/ML IJ SOLN
INTRAMUSCULAR | Status: DC | PRN
  Administered 2022-09-01: 30 mg via INTRAVENOUS

## 2022-09-01 MED ORDER — DEXMEDETOMIDINE HCL IN NACL 400 MCG/100ML IV SOLN
INTRAVENOUS | Status: DC | PRN
  Administered 2022-09-01: 8 ug via INTRAVENOUS
  Administered 2022-09-01: 4 ug via INTRAVENOUS

## 2022-09-01 MED ORDER — OXYCODONE HCL 5 MG PO TABS
ORAL_TABLET | ORAL | Status: AC
  Filled 2022-09-01: qty 1

## 2022-09-01 MED ORDER — MIDAZOLAM HCL 2 MG/2ML IJ SOLN
INTRAMUSCULAR | Status: AC
  Filled 2022-09-01: qty 2

## 2022-09-01 MED ORDER — DEXAMETHASONE SODIUM PHOSPHATE 10 MG/ML IJ SOLN
INTRAMUSCULAR | Status: DC | PRN
  Administered 2022-09-01 (×2): 5 mg via INTRAVENOUS

## 2022-09-01 MED ORDER — DEXMEDETOMIDINE HCL IN NACL 80 MCG/20ML IV SOLN
INTRAVENOUS | Status: AC
  Filled 2022-09-01: qty 20

## 2022-09-01 MED ORDER — GLYCOPYRROLATE PF 0.2 MG/ML IJ SOSY
PREFILLED_SYRINGE | INTRAMUSCULAR | Status: DC | PRN
  Administered 2022-09-01 (×2): .1 mg via INTRAVENOUS

## 2022-09-01 MED ORDER — SODIUM CHLORIDE 0.9% FLUSH: 3.0000 mL | Freq: Two times a day (BID) | INTRAVENOUS | Status: DC

## 2022-09-01 MED ORDER — PHENYLEPHRINE 80 MCG/ML (10ML) SYRINGE FOR IV PUSH (FOR BLOOD PRESSURE SUPPORT)
PREFILLED_SYRINGE | INTRAVENOUS | Status: DC | PRN
  Administered 2022-09-01: 80 ug via INTRAVENOUS

## 2022-09-01 MED ORDER — CEFAZOLIN SODIUM-DEXTROSE 2-4 GM/100ML-% IV SOLN
2.0000 g | INTRAVENOUS | Status: AC
  Administered 2022-09-01: 2 g via INTRAVENOUS

## 2022-09-01 MED ORDER — ARTIFICIAL TEARS OPHTHALMIC OINT
TOPICAL_OINTMENT | OPHTHALMIC | Status: AC
  Filled 2022-09-01: qty 3.5

## 2022-09-01 SURGICAL SUPPLY — 27 items
BAG DRAIN URO-CYSTO SKYTR STRL (DRAIN) ×1 IMPLANT
BAG DRN UROCATH (DRAIN) ×1
BASKET STONE 1.7 NGAGE (UROLOGICAL SUPPLIES) IMPLANT
BASKET ZERO TIP NITINOL 2.4FR (BASKET) IMPLANT
BSKT STON RTRVL ZERO TP 2.4FR (BASKET) ×1
CATH URET 5FR 28IN CONE TIP (BALLOONS)
CATH URET 5FR 70CM CONE TIP (BALLOONS) IMPLANT
CATH URETL OPEN 5X70 (CATHETERS) IMPLANT
CLOTH BEACON ORANGE TIMEOUT ST (SAFETY) ×1 IMPLANT
ELECT REM PT RETURN 9FT ADLT (ELECTROSURGICAL)
ELECTRODE REM PT RTRN 9FT ADLT (ELECTROSURGICAL) IMPLANT
FIBER LASER FLEXIVA 365 (UROLOGICAL SUPPLIES) IMPLANT
GLOVE SURG SS PI 8.0 STRL IVOR (GLOVE) ×1 IMPLANT
GOWN STRL REUS W/TWL XL LVL3 (GOWN DISPOSABLE) ×1 IMPLANT
GUIDEWIRE ANG ZIPWIRE 038X150 (WIRE) IMPLANT
GUIDEWIRE STR DUAL SENSOR (WIRE) ×1 IMPLANT
INFUSOR MANOMETER BAG 3000ML (MISCELLANEOUS) IMPLANT
IV NS IRRIG 3000ML ARTHROMATIC (IV SOLUTION) ×1 IMPLANT
KIT TURNOVER CYSTO (KITS) ×1 IMPLANT
MANIFOLD NEPTUNE II (INSTRUMENTS) ×1 IMPLANT
NS IRRIG 500ML POUR BTL (IV SOLUTION) ×1 IMPLANT
PACK CYSTO (CUSTOM PROCEDURE TRAY) ×1 IMPLANT
SHEATH NAVIGATOR HD 11/13X36 (SHEATH) IMPLANT
TRACTIP FLEXIVA PULS ID 200XHI (Laser) IMPLANT
TRACTIP FLEXIVA PULSE ID 200 (Laser)
TUBE CONNECTING 12X1/4 (SUCTIONS) ×1 IMPLANT
TUBING UROLOGY SET (TUBING) IMPLANT

## 2022-09-01 NOTE — Anesthesia Procedure Notes (Signed)
Procedure Name: LMA Insertion Date/Time: 09/01/2022 7:43 AM  Performed by: Norva Pavlov, CRNAPre-anesthesia Checklist: Patient identified, Emergency Drugs available, Suction available and Patient being monitored Patient Re-evaluated:Patient Re-evaluated prior to induction Oxygen Delivery Method: Circle system utilized Preoxygenation: Pre-oxygenation with 100% oxygen Induction Type: IV induction Ventilation: Mask ventilation without difficulty LMA: LMA inserted LMA Size: 4.0 Number of attempts: 1 Airway Equipment and Method: Bite block Placement Confirmation: positive ETCO2 Tube secured with: Tape Dental Injury: Teeth and Oropharynx as per pre-operative assessment

## 2022-09-01 NOTE — Discharge Instructions (Addendum)
You may remove the stent by pulling the attached string on Monday morning and if you don't feel you can do that, please contact the office to have it done.  DO NOT TAKE MOTRIN UNTIL AFTER 2:20 PM today. DO NOT TAKE TYLENOL UNTIL AFTER 1:10 PM today.   Post Anesthesia Home Care Instructions  Activity: Get plenty of rest for the remainder of the day. A responsible adult should stay with you for 24 hours following the procedure.  For the next 24 hours, DO NOT: -Drive a car -Advertising copywriter -Drink alcoholic beverages -Take any medication unless instructed by your physician -Make any legal decisions or sign important papers.  Meals: Start with liquid foods such as gelatin or soup. Progress to regular foods as tolerated. Avoid greasy, spicy, heavy foods. If nausea and/or vomiting occur, drink only clear liquids until the nausea and/or vomiting subsides. Call your physician if vomiting continues.  Special Instructions/Symptoms: Your throat may feel dry or sore from the anesthesia or the breathing tube placed in your throat during surgery. If this causes discomfort, gargle with warm salt water. The discomfort should disappear within 24 hours.  [facility] [phone] Post Ureteroscopy With or Without Stent Instructions  Definitions:  Ureter: The duct that transports urine from the kidney to the bladder. Stent:   A plastic hollow tube that is placed into the ureter, from the kidney to the bladder to prevent the ureter from swelling shut.  GENERAL INSTRUCTIONS:  Despite the fact that no skin incisions were used, the area around the ureter and bladder is raw and irritated. The stent is a foreign body which will further irritate the bladder wall. This irritation is manifested by increased frequency of urination, both day and night, and by an increase in the urge to urinate. In some, the urge to urinate is present almost always. Sometimes the urge is strong enough that you may  not be able to stop yourself from urinating. The only real cure is to remove the stent and then give time for the bladder wall to heal which can't be done until the danger of the ureter swelling shut has passed, which varies.  You may see some blood in your urine while the stent is in place and a few days afterwards. Do not be alarmed, even if the urine was clear for a while. Get off your feet and drink lots of fluids until clearing occurs. If you start to pass clots or don't improve, call us.  DIET: You may return to your normal diet immediately. Because of the raw surface of your bladder, alcohol, spicy foods, acid type foods and drinks with caffeine may cause irritation or frequency and should be used in moderation. To keep your urine flowing freely and to avoid constipation, drink plenty of fluids during the day ( 8-10 glasses ). Tip: Avoid cranberry juice because it is very acidic.  ACTIVITY: Your physical activity doesn't need to be restricted. However, if you are very active, you may see some blood in your urine. We suggest that you reduce your activity under these circumstances until the bleeding has stopped.  BOWELS: It is important to keep your bowels regular during the postoperative period. Straining with bowel movements can cause bleeding. A bowel movement every other day is reasonable. Use a mild laxative if needed, such as Milk of Magnesia 2-3 tablespoons, or 2 Dulcolax tablets. Call if you continue to have problems. If you have been taking narcotics for pain, before, during or after your  surgery, you may be constipated. Take a laxative if necessary.   MEDICATION: You should resume your pre-surgery medications unless told not to. In addition you will often be given an antibiotic to prevent infection. These should be taken as prescribed until the bottles are finished unless you are having an unusual reaction to one of the drugs.  PROBLEMS YOU SHOULD REPORT TO [place]: Fevers over 100.5  Fahrenheit. Heavy bleeding, or clots ( See above notes about blood in urine ). Inability to urinate. Drug reactions ( hives, rash, nausea, vomiting, diarrhea ). Severe burning or pain with urination that is not improving.  FOLLOW-UP: You will need a follow-up appointment to monitor your progress. Call for this appointment at the number listed above. Usually the first appointment will be about three to fourteen days after your surgery.    [facility] [phone] Post Ureteroscopy With or Without Stent Instructions  Definitions:  Ureter: The duct that transports urine from the kidney to the bladder. Stent:   A plastic hollow tube that is placed into the ureter, from the kidney to the bladder to prevent the ureter from swelling shut.  GENERAL INSTRUCTIONS:  Despite the fact that no skin incisions were used, the area around the ureter and bladder is raw and irritated. The stent is a foreign body which will further irritate the bladder wall. This irritation is manifested by increased frequency of urination, both day and night, and by an increase in the urge to urinate. In some, the urge to urinate is present almost always. Sometimes the urge is strong enough that you may not be able to stop yourself from urinating. The only real cure is to remove the stent and then give time for the bladder wall to heal which can't be done until the danger of the ureter swelling shut has passed, which varies.  You may see some blood in your urine while the stent is in place and a few days afterwards. Do not be alarmed, even if the urine was clear for a while. Get off your feet and drink lots of fluids until clearing occurs. If you start to pass clots or don't improve, call us.  DIET: You may return to your normal diet immediately. Because of the raw surface of your bladder, alcohol, spicy foods, acid type foods and drinks with caffeine may cause irritation or frequency and should be used in  moderation. To keep your urine flowing freely and to avoid constipation, drink plenty of fluids during the day ( 8-10 glasses ). Tip: Avoid cranberry juice because it is very acidic.  ACTIVITY: Your physical activity doesn't need to be restricted. However, if you are very active, you may see some blood in your urine. We suggest that you reduce your activity under these circumstances until the bleeding has stopped.  BOWELS: It is important to keep your bowels regular during the postoperative period. Straining with bowel movements can cause bleeding. A bowel movement every other day is reasonable. Use a mild laxative if needed, such as Milk of Magnesia 2-3 tablespoons, or 2 Dulcolax tablets. Call if you continue to have problems. If you have been taking narcotics for pain, before, during or after your surgery, you may be constipated. Take a laxative if necessary.   MEDICATION: You should resume your pre-surgery medications unless told not to. In addition you will often be given an antibiotic to prevent infection. These should be taken as prescribed until the bottles are finished unless you are having an unusual reaction  to one of the drugs.  PROBLEMS YOU SHOULD REPORT TO [place]: Fevers over 100.5 Fahrenheit. Heavy bleeding, or clots ( See above notes about blood in urine ). Inability to urinate. Drug reactions ( hives, rash, nausea, vomiting, diarrhea ). Severe burning or pain with urination that is not improving.  FOLLOW-UP: You will need a follow-up appointment to monitor your progress. Call for this appointment at the number listed above. Usually the first appointment will be about three to fourteen days after your surgery.

## 2022-09-01 NOTE — Transfer of Care (Signed)
Immediate Anesthesia Transfer of Care Note  Patient: Hannah Cummings  Procedure(s) Performed: Procedure(s) (LRB): CYSTOSCOPY BILATERAL URETEROSCOPY WITH HOLMIUM LASER/STENT EXCHANGE (Bilateral)  Patient Location: PACU  Anesthesia Type: General  Level of Consciousness: awake, alert  and oriented  Airway & Oxygen Therapy: Patient Spontanous Breathing   Post-op Assessment: Report given to PACU RN and Post -op Vital signs reviewed and stable  Post vital signs: Reviewed and stable  Complications: No apparent anesthesia complications  Last Vitals:  Vitals Value Taken Time  BP    Temp    Pulse    Resp    SpO2      Last Pain:  Vitals:   09/01/22 0558  TempSrc: Oral  PainSc: 4       Patients Stated Pain Goal: 2 (09/01/22 0558)  Complications: No notable events documented.

## 2022-09-01 NOTE — Interval H&P Note (Signed)
History and Physical Interval Note:  Hannah Cummings had very tight ureters bilaterally and was just stented at her last encounter.  She has had some discomfort with the stents not unexpectedly and was treated for a UTI this week.   09/01/2022 7:21 AM  Hannah Cummings  has presented today for surgery, with the diagnosis of RIGHT RENAL AND LEFT PROXIMAL STONE.  The various methods of treatment have been discussed with the patient and family. After consideration of risks, benefits and other options for treatment, the patient has consented to  Procedure(s) with comments: CYSTOSCOPY BILATERAL URETEROSCOPY POSSIBLE HOLMIUM LASER/STENT EXCHANGE (Bilateral) - 1 HR FOR CASE as a surgical intervention.  The patient's history has been reviewed, patient examined, no change in status, stable for surgery.  I have reviewed the patient's chart and labs.  Questions were answered to the patient's satisfaction.     Bjorn Pippin

## 2022-09-01 NOTE — Anesthesia Postprocedure Evaluation (Signed)
Anesthesia Post Note  Patient: [name]  Procedure(s) Performed: CYSTOSCOPY BILATERAL URETEROSCOPY WITH HOLMIUM LASER/STENT EXCHANGE (Bilateral)     Patient location during evaluation: PACU Anesthesia Type: General Level of consciousness: awake and alert Pain management: pain level controlled Vital Signs Assessment: post-procedure vital signs reviewed and stable Respiratory status: spontaneous breathing, nonlabored ventilation, respiratory function stable and patient connected to nasal cannula oxygen Cardiovascular status: blood pressure returned to baseline and stable Postop Assessment: no apparent nausea or vomiting Anesthetic complications: no  No notable events documented.  Last Vitals:  Vitals:   09/01/22 0839 09/01/22 0930  BP: 110/62   Pulse: 97 70  Resp: 16 11  Temp: 36.8 C   SpO2: 96% 99%    Last Pain:  Vitals:   09/01/22 0930  TempSrc:   PainSc: 3                  [name]

## 2022-09-01 NOTE — Interval H&P Note (Signed)
History and Physical Interval Note: The UTI was treated last week.   09/01/2022 7:23 AM  Hannah Cummings  has presented today for surgery, with the diagnosis of RIGHT RENAL AND LEFT PROXIMAL STONE.  The various methods of treatment have been discussed with the patient and family. After consideration of risks, benefits and other options for treatment, the patient has consented to  Procedure(s) with comments: CYSTOSCOPY BILATERAL URETEROSCOPY POSSIBLE HOLMIUM LASER/STENT EXCHANGE (Bilateral) - 1 HR FOR CASE as a surgical intervention.  The patient's history has been reviewed, patient examined, no change in status, stable for surgery.  I have reviewed the patient's chart and labs.  Questions were answered to the patient's satisfaction.     Bjorn Pippin

## 2022-09-01 NOTE — Op Note (Signed)
Procedure: 1.  Cystoscopy with removal of left double-J stent. 2.  Left ureteroscopy with stone extraction. 3.  Dilation of right ureteral stricture. 4.  Right ureteroscopy with holmium lasertripsy and stent exchange. 5.  Application of fluoroscopy.  Preop diagnosis: Left proximal ureteral stone and right renal stone with right ureteral stricture.  Postop diagnosis: Same.  Surgeon: Dr. Bjorn Pippin.  Anesthesia: General.  Specimen: Stone fragments.  Drains: Right 6 French by 24 cm contour double-J stent with tether.  EBL: None.  Complications: None.  Indications: The patient is a 26 year old female who had a left proximal ureteral stone with nonprogression and a right renal stone for which she underwent an initial procedure on 08/11/2022.  A small portion of the left ureteral stone was removed but it was not clear that all of the stone was removed but I could not find it in the kidney at the time.  I was also not able to get up to the right kidney because of a tight ureteral stricture in the distal ureter so she was left stented and returns today for completion of her procedures.  Procedure: She was taken operating room where she was given 2 g of Ancef.  A general anesthetic was induced.  She was placed in lithotomy position and fitted with PAS hose.  Her perineum and genitalia were prepped with Betadine solution she was draped in usual sterile fashion.  Cystoscopy was performed using the 21 [place] scope and 30 degree lens.  The bladder inspection was documented from the prior procedure but she had stent loops of both ureteral orifices.  The left stent loop was grasped and pulled the urethral meatus and a guidewire was advanced the kidney under fluoroscopic guidance.  The cystoscope was removed.  The single-lumen digital flexible ureteroscope was then inserted over the wire to the kidney and the wire was removed.  Inspection of the calyceal system demonstrated 1 small stone in a midpole  calyx but no other stones were noted.  The small stone was grasped with the engage basket and removed.  The cystoscope was then reinserted and the right ureteral stent loop was grasped and pulled the urethral meatus.  An initial attempt to pass the wire was unsuccessful due to some encrustation of the lumen.  So the stent was removed.  A sensor wire was then advanced the kidney under fluoroscopic guidance and the cystoscope was removed.  An initial attempt to pass the single-lumen ureteroscope over the wire was unsuccessful due to distal ureteral narrowing.  An 36 cm, 11 French introducer sheath inner core was then advanced over the wire and the distal ureter was dilated.  Once the sheath was passed the intramural ureter it passed easily to the kidney.  I then advanced the assembled 11/13 [place] sheath into the proximal ureter over the wire and remove the inner core and wire.  The single-lumen digital flexible scope was then advanced to the kidney and the right renal stone was found in the lower calyx appeared to measure 3 to 4 mm.  I felt this was too large to remove intact so the 200 m laser fiber was then passed and the stone was fragmented with the laser setting of 0.5 J and 53 Hz.  The stone was broken into 3 fragments which were then removed with the engage basket.  Final inspection revealed no residual stones in the kidney.  The ureteroscope was then removed while visually inspecting the ureter.  The ureter was widely patent but in  the distal ureter where the stricture had been there was evidence of the disrupted stricture was some minor bleeding.  But it was felt replaced and the stent was indicated.  Ureteroscope was then removed and the cystoscope was placed.  A sensor wire was then reinserted to the right kidney and a 6 [place] by 24 cm contour double-J stent with tether was then advanced the kidney under fluoroscopic guidance.  The wire was removed, leaving a good coil in the kidney and a good  coil in the bladder.  The bladder was drained and the cystoscope was removed leaving the stent string exiting the urethra.  Final fluoroscopy revealed good coil in the bladder and the kidney.  The stent string was tied close to the meatus and trimmed to an appropriate length before being tucked vaginally.  She was taken down from lithotomy position, her anesthetic was reversed and she was moved to recovery in stable condition.  There were no complications.  Her mother was given the stone fragments.

## 2022-09-04 ENCOUNTER — Encounter (HOSPITAL_BASED_OUTPATIENT_CLINIC_OR_DEPARTMENT_OTHER): Payer: Self-pay | Admitting: Urology

## 2023-10-26 IMAGING — CT CT ABD-PELV W/ CM
4 series · 13 of 46 positions shown, 18 images · IV contrast (APPLIED)
Comparison: Same day ultrasound

CLINICAL DATA: RLQ abdominal pain (Age >= 14y) RLQ pain

EXAM:
CT ABDOMEN AND PELVIS WITH CONTRAST
TECHNIQUE: Multidetector CT imaging of the abdomen and pelvis was performed
using the standard protocol following bolus administration of
intravenous contrast.

[Series 3: abdomen 5.0 · axial · 0.84mm/px · z∈[+762,+1102]mm · 7 of 92 slices shown, 12 images]
[im 12/92  soft-tissue]
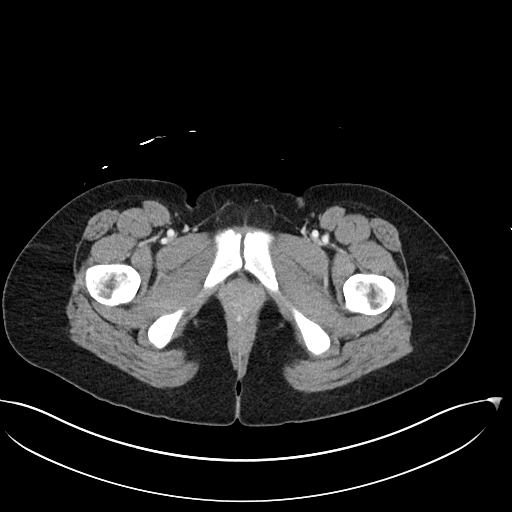
[im 12/92  bone]
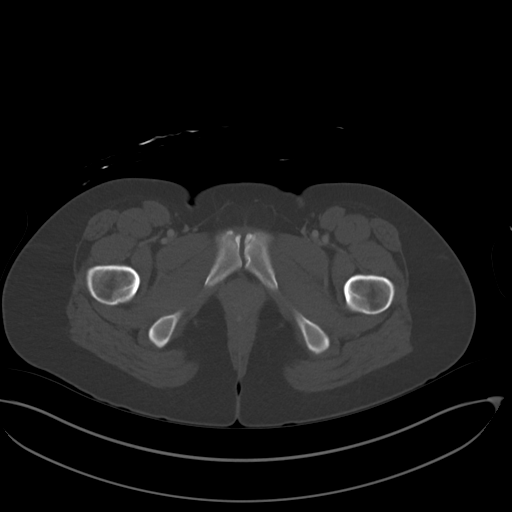
[im 23/92  soft-tissue]
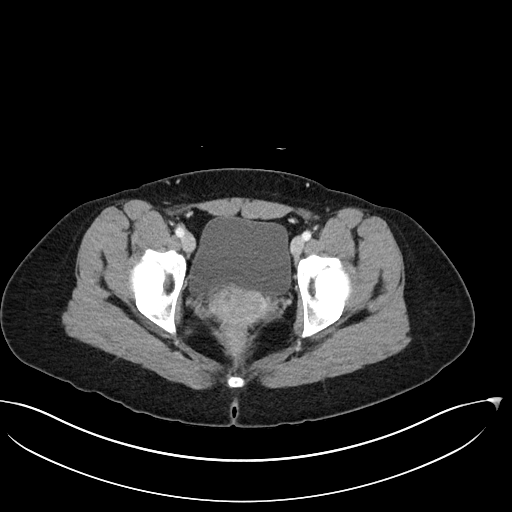
[im 35/92  soft-tissue]
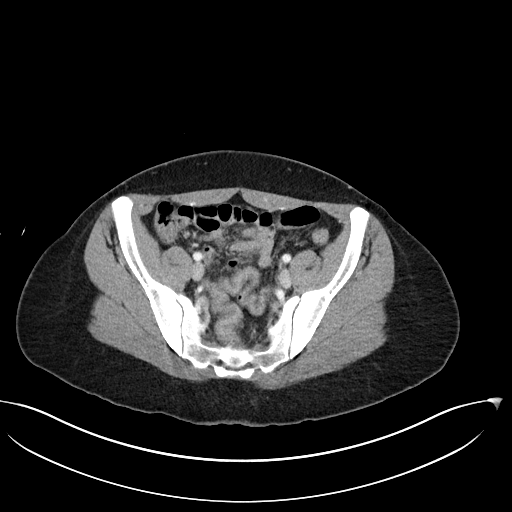
[im 46/92  soft-tissue]
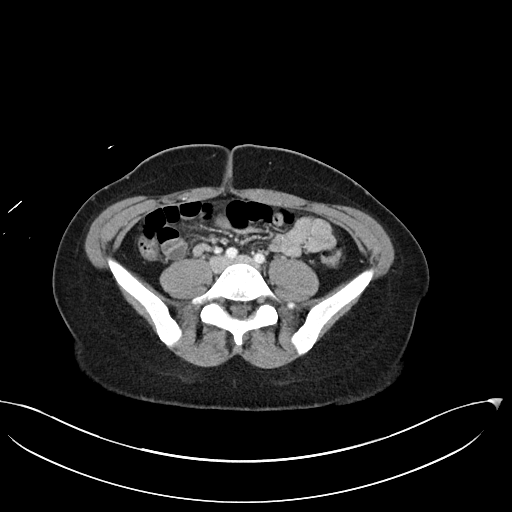
[im 46/92  lung]
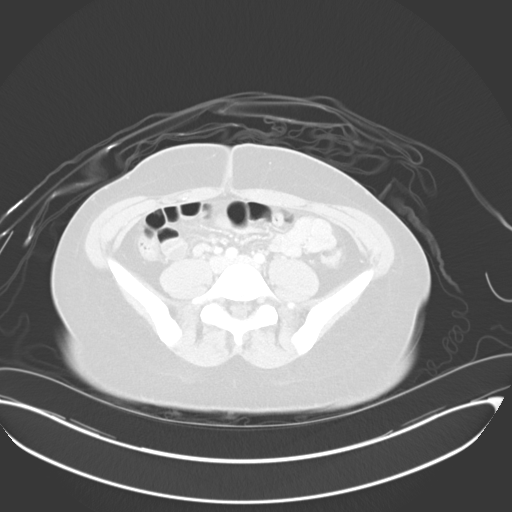
[im 57/92  soft-tissue]
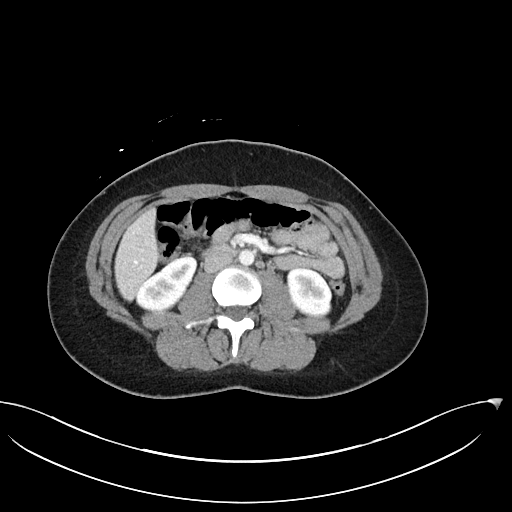
[im 57/92  lung]
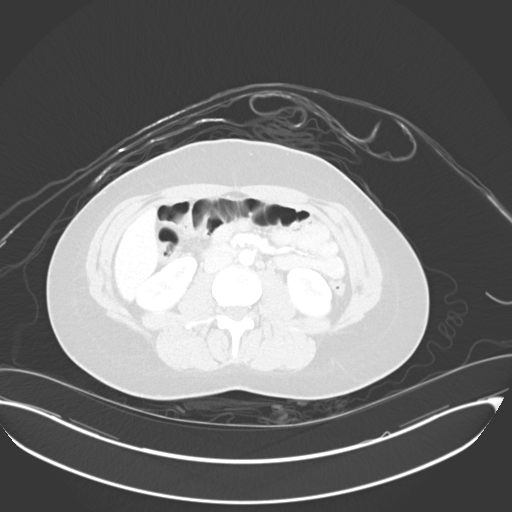
[im 69/92  soft-tissue]
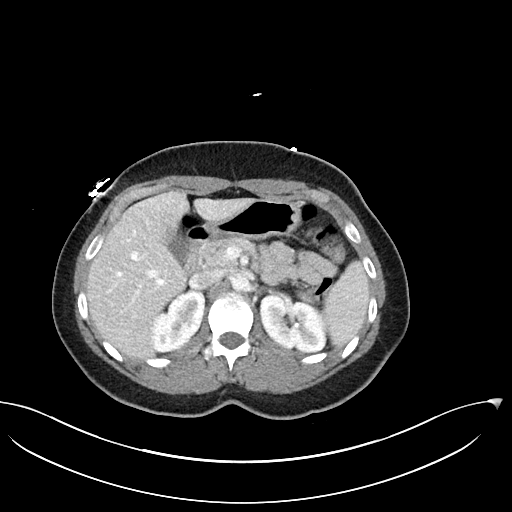
[im 69/92  lung]
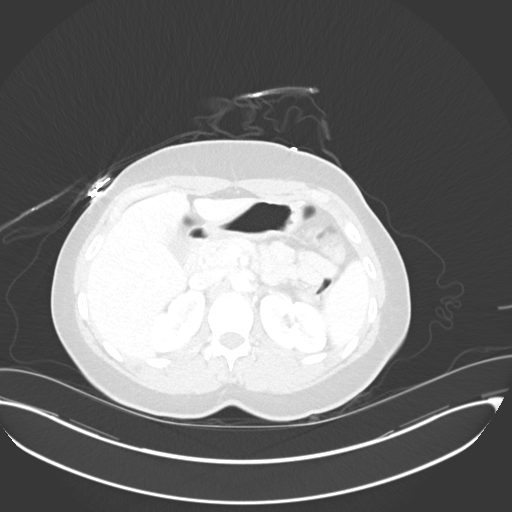
[im 80/92  soft-tissue]
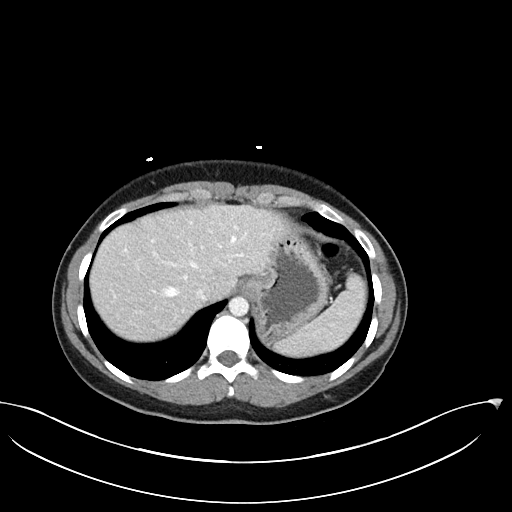
[im 80/92  lung]
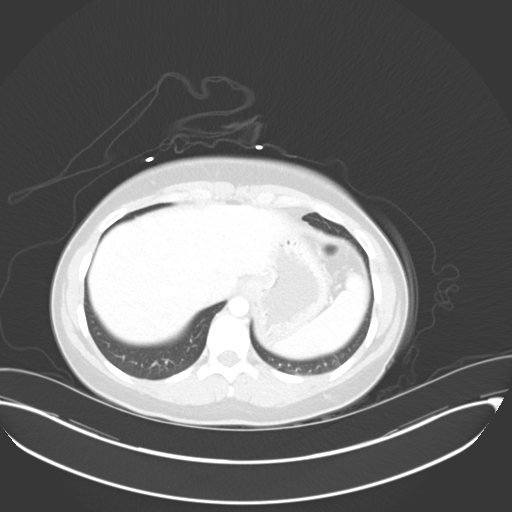

[Series 5: lung · axial · 0.84mm/px · z∈[+748,+790]mm · 2 of 228 slices shown]
[im 21/228  bone]
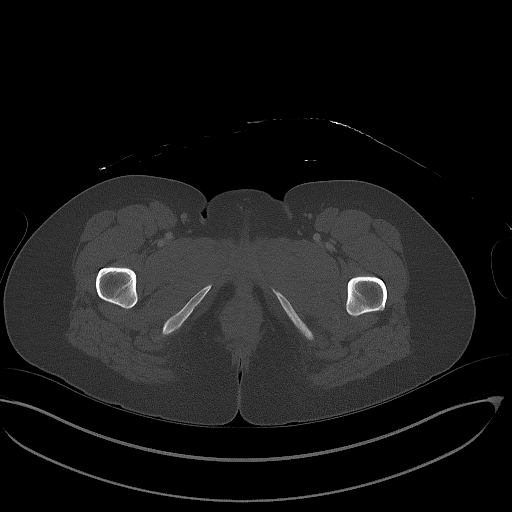
[im 42/228  bone]
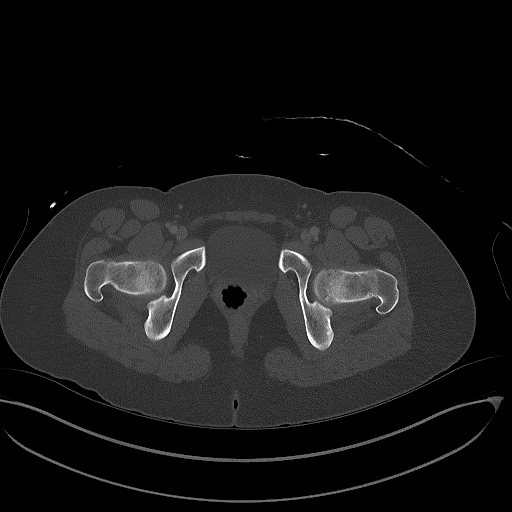

[Series 6: abdomen 3.0 mpr cor · coronal · 0.79mm/px · 3 of 82 slices shown]
[im 28/82  soft-tissue]
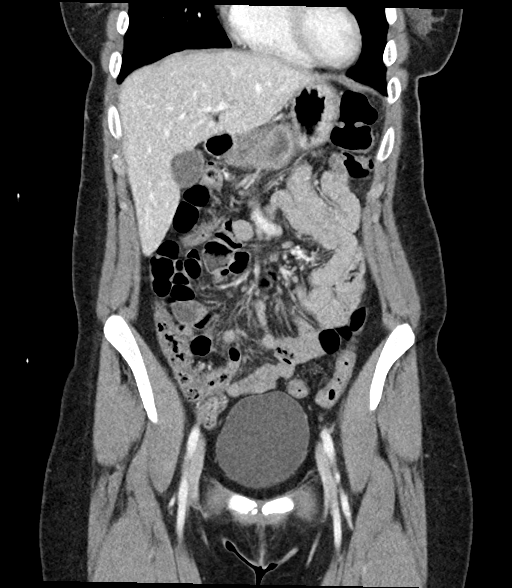
[im 37/82  soft-tissue]
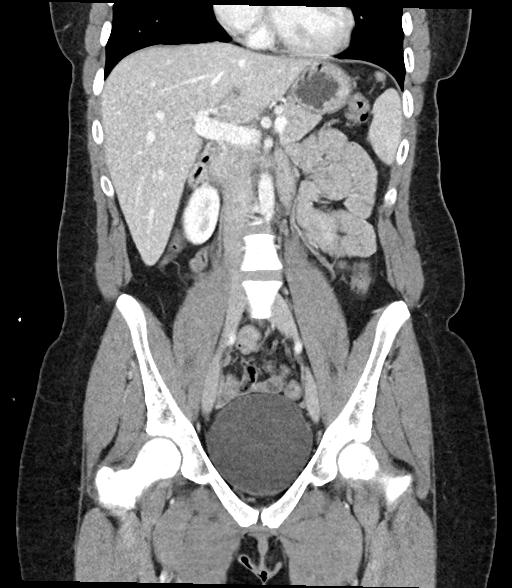
[im 46/82  soft-tissue]
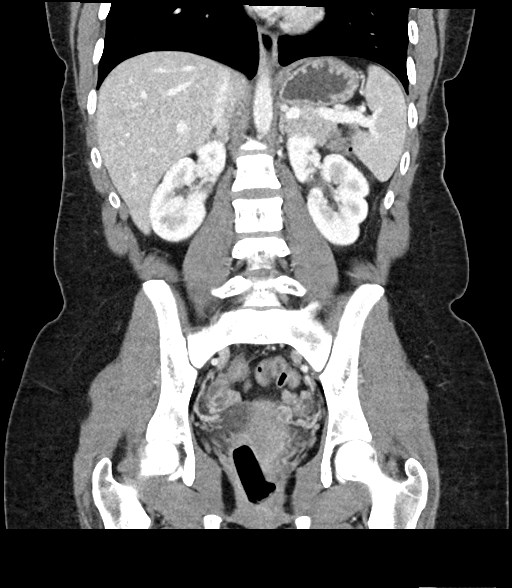

[Series 7: abdomen 3.0 mpr sag · sagittal · 0.48mm/px · 1 of 132 slices shown]
[im 44/132  soft-tissue]
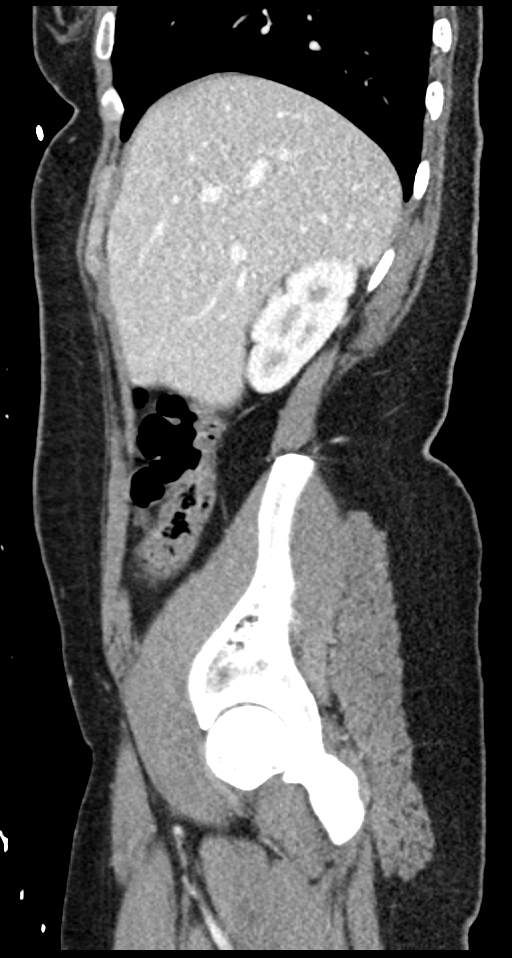

[13 of 46 positions shown; findings below may reference images not displayed]

RADIATION DOSE REDUCTION: This exam was performed according to the
departmental dose-optimization program which includes automated
exposure control, adjustment of the mA and/or kV according to
patient size and/or use of iterative reconstruction technique.

CONTRAST:  100mL OMNIPAQUE IOHEXOL 300 MG/ML  SOLN
FINDINGS: Lower chest: No acute abnormality.

Hepatobiliary: No focal liver abnormality is seen. The gallbladder
is unremarkable.

Pancreas: Unremarkable. No pancreatic ductal dilatation or
surrounding inflammatory changes.

Spleen: Normal in size.  Small adjacent splenules.

Adrenals/Urinary Tract: Adrenal glands are unremarkable. No
hydronephrosis. There are nonobstructive renal stones bilaterally,
measuring 3 mm on the right in the lower pole and 3 mm in the left
upper pole. Bladder is moderately distended.

Stomach/Bowel: The stomach is within normal limits. There is no
evidence of bowel obstruction.The appendix is normal.

Vascular/Lymphatic: No significant vascular findings are present. No
enlarged abdominal or pelvic lymph nodes.

Reproductive: Unremarkable for age.

Other: No abdominal wall hernia or abnormality. No abdominopelvic
ascites.

Musculoskeletal: No acute or significant osseous findings.
IMPRESSION: Nonobstructive nephrolithiasis bilaterally. No hydronephrosis or
ureterolithiasis.

No other acute findings.  The appendix is normal.
